# Patient Record
Sex: Female | Born: 1995 | Race: White | Hispanic: No | Marital: Single | Smoking: Never smoker
Health system: Southern US, Community
[De-identification: ages and names within clinical notes are randomized; demographics above are authoritative.]

## PROBLEM LIST (undated history)

## (undated) DIAGNOSIS — G43909 Migraine, unspecified, not intractable, without status migrainosus: Secondary | ICD-10-CM

## (undated) HISTORY — PX: TONSILLECTOMY: SUR1361

---

## 2015-01-22 DIAGNOSIS — G43909 Migraine, unspecified, not intractable, without status migrainosus: Secondary | ICD-10-CM | POA: Diagnosis not present

## 2015-04-19 DIAGNOSIS — B3320 Viral carditis, unspecified: Secondary | ICD-10-CM | POA: Diagnosis not present

## 2015-09-01 DIAGNOSIS — D224 Melanocytic nevi of scalp and neck: Secondary | ICD-10-CM | POA: Diagnosis not present

## 2015-09-01 DIAGNOSIS — D2261 Melanocytic nevi of right upper limb, including shoulder: Secondary | ICD-10-CM | POA: Diagnosis not present

## 2015-09-01 DIAGNOSIS — D225 Melanocytic nevi of trunk: Secondary | ICD-10-CM | POA: Diagnosis not present

## 2015-09-01 DIAGNOSIS — D2271 Melanocytic nevi of right lower limb, including hip: Secondary | ICD-10-CM | POA: Diagnosis not present

## 2015-10-27 DIAGNOSIS — J0140 Acute pansinusitis, unspecified: Secondary | ICD-10-CM | POA: Diagnosis not present

## 2015-11-25 ENCOUNTER — Ambulatory Visit (INDEPENDENT_AMBULATORY_CARE_PROVIDER_SITE_OTHER): Payer: BLUE CROSS/BLUE SHIELD | Admitting: Family Medicine

## 2015-11-25 ENCOUNTER — Encounter: Payer: Self-pay | Admitting: Family Medicine

## 2015-11-25 VITALS — BP 109/56 | HR 58 | Temp 97.6°F | Resp 16

## 2015-11-25 DIAGNOSIS — J069 Acute upper respiratory infection, unspecified: Secondary | ICD-10-CM

## 2015-11-25 MED ORDER — ALBUTEROL SULFATE HFA 108 (90 BASE) MCG/ACT IN AERS: 2.0000 | INHALATION_SPRAY | Freq: Four times a day (QID) | RESPIRATORY_TRACT | Status: DC | PRN

## 2015-11-25 MED ORDER — AZITHROMYCIN 250 MG PO TABS: ORAL_TABLET | ORAL | Status: DC

## 2015-11-25 NOTE — Progress Notes (Signed)
Patient ID: Claudia Austin, female   DOB: [birth year], 20 y.o.   MRN: [id number]  Patient presents today with symptoms of mild productive cough for the last week. Patient states that she was treated for sinus infection prior to that but did not complete the antibiotics that were prescribed to her. She has some nasal congestion but most of her symptoms are in the chest now. She denies any fever or chills. She has been coughing with exercising. She denies any wheezing. She admits to having asthma earlier in her childhood. She denies any abdominal pain, chest pain, shortness of breath, lower extremity edema, nausea, vomiting, diarrhea. She has not taken any medications today.  ROS: Negative except mentioned above.  Vitals as per Epic.  GENERAL: NAD HEENT: mild pharyngeal erythema, no exudate, no erythema of TMs, no cervical LAD RESP: mild rhonci, no accessory muscle use, no wheezing  CARD: RRR NEURO: CN II-XII grossly intact   A/P: URI- will treat with Z-Pak, albuterol when necessary, Delsym when necessary, Claritin when necessary, rest, hydration, seek medical attention if symptoms persist or worsen as discussed.

## 2015-12-02 ENCOUNTER — Encounter: Payer: Self-pay | Admitting: Family Medicine

## 2015-12-02 ENCOUNTER — Ambulatory Visit (INDEPENDENT_AMBULATORY_CARE_PROVIDER_SITE_OTHER): Payer: BLUE CROSS/BLUE SHIELD | Admitting: Family Medicine

## 2015-12-02 VITALS — BP 110/71 | HR 53 | Temp 98.0°F | Resp 14

## 2015-12-02 DIAGNOSIS — H9202 Otalgia, left ear: Secondary | ICD-10-CM | POA: Diagnosis not present

## 2015-12-02 DIAGNOSIS — R0981 Nasal congestion: Secondary | ICD-10-CM | POA: Diagnosis not present

## 2015-12-02 MED ORDER — PREDNISONE 20 MG PO TABS: ORAL_TABLET | ORAL | Status: DC

## 2015-12-02 NOTE — Addendum Note (Signed)
Addended by: Dione HousekeeperPATEL, Yoona Ishii N on: 12/02/2015 11:22 AM   Modules accepted: Orders

## 2015-12-02 NOTE — Progress Notes (Signed)
Patient ID: [name], female   DOB: [birth year], 20 y.o.   MRN: [id number]   Patient presents today with symptoms of left ear fullness. Patient states that she at times has problems hearing out of the ear. She was treated for upper respiratory infection and sinusitis last week. She is completed the antibiotic course. She denies any fever or chills. She has had some postnasal drip. She denies any significant sinus headache. She denies any drainage from the ear.  ROS: Negative except mentioned above.  Vitals as per Epic GENERAL: NAD HEENT: no pharyngeal erythema, no exudate, no erythema of TMs, increased lucency noted of the left TM, no drainage from the ear, no cervical LAD RESP: CTA B CARD: RRR NEURO: CN II-XII grossly intact   A/P: Left ear pain- likely related to sinus congestion and fluid, there does not appear to be any active infection, encourage patient to take Sudafed for a few days as well as Claritin for postnasal drip, will also prescribe short course of prednisone. If symptoms do persist or worsen will send patient to ENT as discussed.

## 2015-12-07 ENCOUNTER — Encounter: Payer: Self-pay | Admitting: Emergency Medicine

## 2015-12-07 ENCOUNTER — Emergency Department
Admission: EM | Admit: 2015-12-07 | Discharge: 2015-12-07 | Disposition: A | Discharge status: Home / Self Care | Payer: BLUE CROSS/BLUE SHIELD | Attending: Emergency Medicine | Admitting: Emergency Medicine

## 2015-12-07 DIAGNOSIS — G43901 Migraine, unspecified, not intractable, with status migrainosus: Secondary | ICD-10-CM

## 2015-12-07 DIAGNOSIS — G43801 Other migraine, not intractable, with status migrainosus: Secondary | ICD-10-CM | POA: Diagnosis not present

## 2015-12-07 DIAGNOSIS — R42 Dizziness and giddiness: Secondary | ICD-10-CM | POA: Diagnosis present

## 2015-12-07 HISTORY — DX: Migraine, unspecified, not intractable, without status migrainosus: G43.909

## 2015-12-07 LAB — BASIC METABOLIC PANEL
ANION GAP: 9 (ref 5–15)
BUN: 14 mg/dL (ref 6–20)
CALCIUM: 9.9 mg/dL (ref 8.9–10.3)
CO2: 28 mmol/L (ref 22–32)
Chloride: 102 mmol/L (ref 101–111)
Creatinine, Ser: 0.94 mg/dL (ref 0.44–1.00)
GFR calc Af Amer: >60 mL/min (ref >60)
GLUCOSE: 92 mg/dL (ref 65–99)
Potassium: 3.2 mmol/L — ABNORMAL LOW (ref 3.5–5.1)
Sodium: 139 mmol/L (ref 135–145)

## 2015-12-07 LAB — CBC
HCT: 40.1 % (ref 35.0–47.0)
Hemoglobin: 13.6 g/dL (ref 12.0–16.0)
MCH: 30.6 pg (ref 26.0–34.0)
MCHC: 33.9 g/dL (ref 32.0–36.0)
MCV: 90.2 fL (ref 80.0–100.0)
PLATELETS: 354 10*3/uL (ref 150–440)
RBC: 4.45 MIL/uL (ref 3.80–5.20)
RDW: 13.3 % (ref 11.5–14.5)
WBC: 16.5 10*3/uL — ABNORMAL HIGH (ref 3.6–11.0)

## 2015-12-07 LAB — URINALYSIS COMPLETE WITH MICROSCOPIC (ARMC ONLY)
BILIRUBIN URINE: NEGATIVE
GLUCOSE, UA: NEGATIVE mg/dL
HGB URINE DIPSTICK: NEGATIVE
Ketones, ur: NEGATIVE mg/dL
Leukocytes, UA: NEGATIVE
NITRITE: NEGATIVE
Protein, ur: NEGATIVE mg/dL
SPECIFIC GRAVITY, URINE: 1.004 — AB (ref 1.005–1.030)
pH: 7 (ref 5.0–8.0)

## 2015-12-07 LAB — POCT PREGNANCY, URINE: PREG TEST UR: NEGATIVE

## 2015-12-07 MED ORDER — KETOROLAC TROMETHAMINE 30 MG/ML IJ SOLN
30.0000 mg | Freq: Once | INTRAMUSCULAR | Status: AC
  Administered 2015-12-07: 30 mg via INTRAVENOUS
  Filled 2015-12-07: qty 1

## 2015-12-07 MED ORDER — SODIUM CHLORIDE 0.9 % IV BOLUS (SEPSIS)
1000.0000 mL | Freq: Once | INTRAVENOUS | Status: AC
  Administered 2015-12-07: 1000 mL via INTRAVENOUS

## 2015-12-07 MED ORDER — PROCHLORPERAZINE EDISYLATE 5 MG/ML IJ SOLN
10.0000 mg | Freq: Once | INTRAMUSCULAR | Status: AC
  Administered 2015-12-07: 10 mg via INTRAVENOUS
  Filled 2015-12-07: qty 2

## 2015-12-07 MED ORDER — DIPHENHYDRAMINE HCL 50 MG/ML IJ SOLN
25.0000 mg | Freq: Once | INTRAMUSCULAR | Status: AC
  Administered 2015-12-07: 25 mg via INTRAVENOUS
  Filled 2015-12-07: qty 1

## 2015-12-07 NOTE — ED Notes (Signed)
MD at bedside. 

## 2015-12-07 NOTE — ED Provider Notes (Addendum)
[facility] Emergency Department Provider Note   ____________________________________________  Time seen: Approximately 950 PM  I have reviewed the triage vital signs and the nursing notes.   HISTORY  Chief Complaint Dizziness    HPI Claudia Austin is a 20 y.o. female with a history of migraine headaches who is presenting to the emergency department today with a persistent migraine. She says that yesterday she began to have a frontal migraine headache and took sumatriptan as well as Phenergan. She says that these medications usually relieve her symptoms and that the day after she has a migraines usually feels tired. However, she has had persistent migraine headache with about a 3 out of 10 pain behind the bilateral eyes. She has photophobia and has also had lightheadedness and vomiting today. Because of the persistent symptoms she came in to the emergency room for further evaluation and treatment. She does not know any factors that set off her migraines. She says she has been having them for about a year.   Past Medical History:  Diagnosis Date  . Migraine     There are no active problems to display for this patient.   Past Surgical History:  Procedure Laterality Date  . TONSILLECTOMY Bilateral    age 61    Current Outpatient Rx  . Order #: [id number] Class: Normal  . Order #: [id number] Class: Normal  . Order #: [id number] Class: Normal    Allergies Review of patient's allergies indicates no known allergies.  Family History  Problem Relation Age of Onset  . Hypertension Mother   . Diabetes Maternal Grandmother     Social History Social History  Substance Use Topics  . Smoking status: Never Smoker  . Smokeless tobacco: Never Used  . Alcohol use No    Review of Systems Constitutional: No fever/chills Eyes: No visual changes. ENT: No sore throat. Cardiovascular: Denies chest pain. Respiratory: Denies shortness of  breath. Gastrointestinal: No abdominal pain.  No nausea, no vomiting.  No diarrhea.  No constipation. Genitourinary: Negative for dysuria. Musculoskeletal: Negative for back pain. Skin: Negative for rash. Neurological: Negative for focal weakness or numbness.  10-point ROS otherwise negative.  ____________________________________________   PHYSICAL EXAM:  VITAL SIGNS: ED Triage Vitals  Enc Vitals Group     BP 12/07/15 2045 136/79     Pulse Rate 12/07/15 2045 72     Resp 12/07/15 2045 18     Temp 12/07/15 2045 97.7 F (36.5 C)     Temp Source 12/07/15 2045 Oral     SpO2 12/07/15 2045 100 %     Weight 12/07/15 2045 132 lb (59.9 kg)     Height 12/07/15 2045  (1.753 m)     Head Circumference --      Peak Flow --      Pain Score 12/07/15 2145 6     Pain Loc --      Pain Edu? --      Excl. in GC? --     Constitutional: Alert and oriented. Well appearing and in no acute distress. Eyes: Conjunctivae are normal. PERRL. EOMI. Head: Atraumatic. Nose: No congestion/rhinnorhea. Mouth/Throat: Mucous membranes are moist.   Neck: No stridor.   Cardiovascular: Normal rate, regular rhythm. Grossly normal heart sounds.   Respiratory: Normal respiratory effort.  No retractions. Lungs CTAB. Gastrointestinal: Soft and nontender. No distention.  Musculoskeletal: No lower extremity tenderness nor edema.  No joint effusions. Neurologic:  Normal speech and language. No gross focal neurologic deficits are appreciated.  Skin:  Skin is warm, dry and intact. No rash noted. Psychiatric: Mood and affect are normal. Speech and behavior are normal.  ____________________________________________   LABS (all labs ordered are listed, but only abnormal results are displayed)  Labs Reviewed  BASIC METABOLIC PANEL - Abnormal; Notable for the following:       Result Value   Potassium 3.2 (*)    All other components within normal limits  CBC - Abnormal; Notable for the following:    WBC 16.5 (*)     All other components within normal limits  URINALYSIS COMPLETEWITH MICROSCOPIC ([facility] ONLY) - Abnormal; Notable for the following:    Color, Urine COLORLESS (*)    APPearance CLEAR (*)    Specific Gravity, Urine 1.004 (*)    Bacteria, UA RARE (*)    Squamous Epithelial / LPF 0-5 (*)    All other components within normal limits  POC URINE PREG, ED  POCT PREGNANCY, URINE   ____________________________________________  EKG   ____________________________________________  RADIOLOGY   ____________________________________________   PROCEDURES  Procedures  ____________________________________________   INITIAL IMPRESSION / ASSESSMENT AND PLAN / ED COURSE  Pertinent labs & imaging results that were available during my care of the patient were reviewed by me and considered in my medical decision making (see chart for details).  ----------------------------------------- 11:06 PM on 12/07/2015 -----------------------------------------  Patient with status migrainosus. Will switch to IV pain medications. Signed out to Dr. Manson Passey.  Status migrainosus.   ____________________________________________   FINAL CLINICAL IMPRESSION(S) / ED DIAGNOSES  Final diagnoses:  None      NEW MEDICATIONS STARTED DURING THIS VISIT:  New Prescriptions   No medications on file     Note:  This document was prepared using Dragon voice recognition software and may include unintentional dictation errors.    [name], MD 12/07/15 2306  Patient asymptomatic after IV migraine treatment. Will be discharged home. Will follow-up with her current care doctor, Dr. Sherryll Burger at the [facility].   [name], MD 12/07/15 [phone]

## 2015-12-07 NOTE — ED Notes (Signed)
Coach at the bedside. Pt states 2/10 headache dizziness almost gone. Pt states feeling much better.

## 2015-12-07 NOTE — ED Triage Notes (Signed)
Pt reports had migraine yesterday and took sumatriptan 50 mg and phenergan 25 mg yesterday, reports since yesterday pt has been feeling shaky, dizzy, and nauseous. Pt reports migraine has been relieved. Pt alert and speaking in complete sentences. Speaking in complete sentences, pt appears in no acute distress.

## 2015-12-07 NOTE — ED Notes (Signed)
Pt reports had migraine yesterday and took sumatriptan 50 mg and phenergan 25 mg yesterday, reports since yesterday pt has been feeling shaky, dizzy, and nauseous vomited 5 times since 7pm. Pt reports migraine has been relieved. Pt states slight headache dizziness 6/10.

## 2015-12-07 NOTE — ED Notes (Signed)
Pt just given IVP pain and nausea meds. No distress noted. Will continue to monitor.

## 2015-12-07 NOTE — ED Notes (Signed)
Discharge instructions reviewed with patient. Questions fielded by this RN. Patient verbalizes understanding of instructions. Patient discharged home in stable condition per Schaevitz MD. No acute distress noted at time of discharge.   

## 2015-12-07 NOTE — ED Notes (Signed)
POC urine preg NEGATIVE. 

## 2016-02-11 ENCOUNTER — Ambulatory Visit (INDEPENDENT_AMBULATORY_CARE_PROVIDER_SITE_OTHER): Payer: BLUE CROSS/BLUE SHIELD | Admitting: Family Medicine

## 2016-02-11 VITALS — BP 120/57 | HR 68 | Temp 98.4°F | Resp 14

## 2016-02-11 DIAGNOSIS — J018 Other acute sinusitis: Secondary | ICD-10-CM

## 2016-02-11 MED ORDER — FLUTICASONE PROPIONATE 50 MCG/ACT NA SUSP: 2.0000 | Freq: Every day | NASAL | 6 refills | Status: DC

## 2016-02-11 MED ORDER — AMOXICILLIN 500 MG PO CAPS: 500.0000 mg | ORAL_CAPSULE | Freq: Two times a day (BID) | ORAL | 0 refills | Status: DC

## 2016-02-11 NOTE — Progress Notes (Signed)
Patient presents today with symptoms of sinus pressure, headache, fatigue, postnasal drip. Patient has had symptoms for the last week. She denies any fever, chills, severe headache, chest pain, shortness of breath. Her teeth have also been hurting due to the sinus pressure. Normal menstrual cycles, last menstrual cycle 3 weeks ago.  ROS: Negative except mentioned above.  GENERAL: NAD HEENT: mild pharyngeal erythema, no exudate, no erythema of TMs, mild sinus tenderness, no cervical LAD RESP: CTA B CARD: RRR NEURO: CN II-XII grossly intact   A/P: Sinusitis - Flonase when necessary, Sudafed when necessary, Claritin when necessary, Amoxicillin, rest, hydration, no athletic activity afebrile, follow-up when necessary.

## 2016-03-09 ENCOUNTER — Ambulatory Visit
Admission: RE | Admit: 2016-03-09 | Discharge: 2016-03-09 | Disposition: A | Discharge status: Home / Self Care | Payer: BLUE CROSS/BLUE SHIELD | Source: Ambulatory Visit | Attending: Family Medicine | Admitting: Family Medicine

## 2016-03-09 ENCOUNTER — Ambulatory Visit (INDEPENDENT_AMBULATORY_CARE_PROVIDER_SITE_OTHER): Payer: BLUE CROSS/BLUE SHIELD | Admitting: Family Medicine

## 2016-03-09 ENCOUNTER — Encounter: Payer: Self-pay | Admitting: Family Medicine

## 2016-03-09 VITALS — BP 115/54 | HR 82 | Temp 97.9°F

## 2016-03-09 DIAGNOSIS — R05 Cough: Secondary | ICD-10-CM | POA: Diagnosis not present

## 2016-03-09 DIAGNOSIS — R053 Chronic cough: Secondary | ICD-10-CM

## 2016-03-09 MED ORDER — AZITHROMYCIN 250 MG PO TABS: ORAL_TABLET | ORAL | 0 refills | Status: DC

## 2016-03-09 NOTE — Progress Notes (Signed)
Patient presents today for persistent nasal congestion and cough. Patient states that she does have postnasal drip that has continued since her last visit with me last month. She did finish the course of antibiotic that was given to her itch was amoxicillin. She denies any chest pain or shortness of breath. She denies any history of asthma. She denies any history of chronic allergies. She is no longer taking an antihistamine or decongestant or nasal steroid. She was on Sudafed for a few weeks but stopped using it recently. At times the cough is productive. She denies any known new allergies. She denies any weight loss or fever or myalgias.  ROS: Negative except mentioned above. Vitals as per Epic.  GENERAL: NAD HEENT: mild pharyngeal erythema, no exudate, no erythema of TMs, no cervical LAD RESP: CTA B CARD: RRR NEURO: CN II-XII grossly intact   A/P: Persistent cough, nasal congestion - will do chest x-ray, Z-Pak, start antihistamine and Flonase, consider inhaler, seek medical attention if symptoms persist or worsen as discussed.

## 2016-07-04 ENCOUNTER — Ambulatory Visit (INDEPENDENT_AMBULATORY_CARE_PROVIDER_SITE_OTHER): Payer: BLUE CROSS/BLUE SHIELD | Admitting: Family Medicine

## 2016-07-04 ENCOUNTER — Encounter: Payer: Self-pay | Admitting: Family Medicine

## 2016-07-04 VITALS — BP 114/49 | HR 58

## 2016-07-04 DIAGNOSIS — S39012A Strain of muscle, fascia and tendon of lower back, initial encounter: Secondary | ICD-10-CM

## 2016-07-04 DIAGNOSIS — M62830 Muscle spasm of back: Secondary | ICD-10-CM

## 2016-07-04 MED ORDER — NAPROXEN 500 MG PO TABS: 500.0000 mg | ORAL_TABLET | Freq: Two times a day (BID) | ORAL | 0 refills | Status: DC

## 2016-07-04 MED ORDER — CYCLOBENZAPRINE HCL 5 MG PO TABS: 5.0000 mg | ORAL_TABLET | Freq: Two times a day (BID) | ORAL | 0 refills | Status: DC | PRN

## 2016-07-13 NOTE — Progress Notes (Signed)
Patient presents today with symptoms of generalized lower back tightness. 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 She denies any incident or trauma. She denies any incontinence, radicular symptoms, foot drop, fever/chills. She has been taking some over-the-counter medication with some relief. She denies any chronic back problems.  ROS: Negative except mentioned above. Vitals as per Epic. GENERAL: NAD RESP: CTA B CARD: RRR MSK: No midline tenderness, mild generalized paravertebral tenderness in the upper lumbar area bilaterally, full range of motion, negative straight leg raise, 5 out of 5 strength of lower extremities, NV intact NEURO: CN II-XII grossly intact   A/P: Lower back strain, spasm - will treat with Naproxen and Flexeril when necessary, rehabilitation with trainer and modify activity as needed, this was discussed with trainer and patient. Seek medical attention if symptoms persist or worsen as discussed.

## 2016-08-31 ENCOUNTER — Ambulatory Visit: Payer: BLUE CROSS/BLUE SHIELD | Admitting: Family Medicine

## 2016-11-16 DIAGNOSIS — J309 Allergic rhinitis, unspecified: Secondary | ICD-10-CM | POA: Diagnosis not present

## 2016-11-16 DIAGNOSIS — J4540 Moderate persistent asthma, uncomplicated: Secondary | ICD-10-CM | POA: Diagnosis not present

## 2016-11-16 DIAGNOSIS — Z6820 Body mass index (BMI) 20.0-20.9, adult: Secondary | ICD-10-CM | POA: Diagnosis not present

## 2016-11-16 DIAGNOSIS — J0140 Acute pansinusitis, unspecified: Secondary | ICD-10-CM | POA: Diagnosis not present

## 2016-11-30 ENCOUNTER — Ambulatory Visit (INDEPENDENT_AMBULATORY_CARE_PROVIDER_SITE_OTHER): Payer: BLUE CROSS/BLUE SHIELD | Admitting: Family Medicine

## 2016-11-30 ENCOUNTER — Encounter: Payer: Self-pay | Admitting: Family Medicine

## 2016-11-30 VITALS — BP 120/68 | HR 65 | Temp 97.5°F | Resp 14 | Wt 135.2 lb

## 2016-11-30 DIAGNOSIS — R05 Cough: Secondary | ICD-10-CM | POA: Diagnosis not present

## 2016-11-30 DIAGNOSIS — R5383 Other fatigue: Secondary | ICD-10-CM | POA: Diagnosis not present

## 2016-11-30 DIAGNOSIS — R059 Cough, unspecified: Secondary | ICD-10-CM

## 2016-12-01 ENCOUNTER — Other Ambulatory Visit: Payer: Self-pay | Admitting: Family Medicine

## 2016-12-01 ENCOUNTER — Telehealth: Payer: Self-pay | Admitting: Internal Medicine

## 2016-12-01 DIAGNOSIS — R05 Cough: Secondary | ICD-10-CM

## 2016-12-01 DIAGNOSIS — R059 Cough, unspecified: Secondary | ICD-10-CM

## 2016-12-01 DIAGNOSIS — R5383 Other fatigue: Secondary | ICD-10-CM

## 2016-12-01 NOTE — Telephone Encounter (Signed)
This is the 1st available unless someone cancels. Nothing further needed.

## 2016-12-01 NOTE — Progress Notes (Signed)
Patient presents today with symptoms of chronic cough for the last few months, fatigue, shortness of breath. Patient states that she went to see her primary care physician about a week ago and was told that she had walking pneumonia. She states that she had some blood work done and was treated with a Z-Pak and Albuterol Inhaler. She did have an x-ray as well but does not know the official results of that x-ray. She denies any significant weight loss, hemoptysis, calf swelling or tenderness, palpitations, severe headache. She denies being a smoker. She has noticed excessive sweating at times. She has not checked her temperature when this has happened. She states that she has been using albuterol inhaler once in the morning and once in the evening. She denies any new allergens. She was told by her primary care physician to try taking a oral antihistamine which she has been doing. She does not feel he significant difference with using the Albuterol Inhaler or the antihistamine. She denies ever having any allergy testing in the past. She is able to do physical activity on the basketball court in particular but does feel winded/fatigued with physical activity. She denies any palpitations or chest pain. She would say that her bowel movements are more on the loose side instead of being constipated. She admits to getting abdominal cramps often in the past but has gone gluten free and these cramps have decreased greatly. She denies any recent travel outside of the country. She denies any recent tick bites. She denies any history of Lyme's disease or RMSF. She admits to a family history of thyroid disease with her mother and father.  ROS: Negative except mentioned above.  GENERAL: NAD HEENT: no pharyngeal erythema, no exudate, no erythema of TMs, no cervical LAD, no thyroid enlargement or nodules appreciated RESP: CTA B CARD: RRR ABD: +BS, NT, no rebound or guarding NEURO: CN II-XII grossly intact    A/P: Chronic  cough, fatigue, shortness of breath - asked patient to have lab results faxed to me that were done by her primary care physician, would add thyroid panel if not done by primary care physician, given symptoms above. Would also recommend that patient follow-up with pulmonary for further evaluation. May need spirometry, CT chest, etc. Discussed with patient the proper way of using her albuterol inhaler. Will seek medical attention if symptoms persist or worsen as discussed.

## 2016-12-01 NOTE — Telephone Encounter (Signed)
[facility] called Urgent new patient referral per dr patel for cough and fatigue Scheduled next available 8/24 9AM with Kasa Added to waitlist  Please advise

## 2016-12-05 LAB — VITAMIN D 25 HYDROXY (VIT D DEFICIENCY, FRACTURES): Vit D, 25-Hydroxy: 38.3 ng/mL (ref 30.0–100.0)

## 2016-12-05 LAB — TSH+FREE T4
FREE T4: 1.08 ng/dL (ref 0.82–1.77)
TSH: 1.44 u[IU]/mL (ref 0.450–4.500)

## 2016-12-05 NOTE — Progress Notes (Signed)
Patient presents today for symptoms of cough, fatigue, headaches, nausea. Patient states that she has had the symptoms for the last few months. She states that she was diagnosed with walking pneumonia by her primary care provider. She was put on a Z-Pak and completed this a few weeks ago. She states that at times she feels really hot however has not checked her temperature. She continues to have cough and some shortness of breath. She admits that there was blood work done and a chest x-ray done by her primary care provider. She believes a chest x-ray results were normal. She does not have the results of her blood work with her today. She denies any significant weight loss. She admits that her stools are more looser then constipated. She denies any palpitations during exercise or chest pain. In addition to her Z-Pak she was also given an albuterol inhaler which she has been using twice daily. She does not think that the inhaler has been helping much with her symptoms. She also was told to try taking an oral antihistamine daily. She denies any significant new environmental allergens. She denies any childhood diagnosis of asthma. She denies smoking cigarettes or any other illicit drugs. She denies any rash. She denies any recent tick bites. She denies taking any new medications or supplements. She does have family history of thyroid disease with her mother and also thyroid cancer with her father.  ROS: Negative except mentioned above.  GENERAL: NAD HEENT: no pharyngeal erythema, no exudate, no erythema of TMs, no cervical LAD RESP: CTA B CARD: RRR ABD: Positive bowel sounds, nontender NEURO: CN II-XII grossly intact   A/P: Cough, fatigue -would like patient to obtain results that were done by her primary care provider for my review. Physical exam benign today. Would recommend seeing pulmonary for further workup with spirometry, labs, etc. If any acute problems patient will seek medical attention. I've  advised on the proper way of using albuterol inhaler as needed. Continue taking oral antihistamine daily for now.

## 2016-12-08 ENCOUNTER — Ambulatory Visit (INDEPENDENT_AMBULATORY_CARE_PROVIDER_SITE_OTHER): Payer: BLUE CROSS/BLUE SHIELD | Admitting: Internal Medicine

## 2016-12-08 ENCOUNTER — Encounter: Payer: Self-pay | Admitting: Internal Medicine

## 2016-12-08 ENCOUNTER — Other Ambulatory Visit
Admission: RE | Admit: 2016-12-08 | Discharge: 2016-12-08 | Disposition: A | Discharge status: Home / Self Care | Payer: BLUE CROSS/BLUE SHIELD | Source: Ambulatory Visit | Attending: Internal Medicine | Admitting: Internal Medicine

## 2016-12-08 VITALS — BP 130/72 | HR 68 | Ht 69.0 in | Wt 138.0 lb

## 2016-12-08 DIAGNOSIS — R5383 Other fatigue: Secondary | ICD-10-CM | POA: Diagnosis not present

## 2016-12-08 DIAGNOSIS — R079 Chest pain, unspecified: Secondary | ICD-10-CM | POA: Diagnosis not present

## 2016-12-08 DIAGNOSIS — R05 Cough: Secondary | ICD-10-CM | POA: Diagnosis not present

## 2016-12-08 DIAGNOSIS — R059 Cough, unspecified: Secondary | ICD-10-CM

## 2016-12-08 LAB — PREGNANCY, URINE: PREG TEST UR: NEGATIVE

## 2016-12-08 NOTE — Patient Instructions (Signed)
Check pregnancy test Verify technique of inhaler use will refer to cardiology for chest pain

## 2016-12-08 NOTE — Progress Notes (Signed)
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 for 5-6 months Patient says this cough worsens with exercise although it does happen at rest Patient does and has been exposed to cats and dogs and says she is not allergic to that There is intermittent wheezing associated with her cough As a child she has had exercise-induced cough and shortness of breath and has had a cardiac workup as a child Growing up as a child patient has played for sports year Patient has associated increased shortness of breath and dyspnea on exertion Patient was given a Z-Pak on July 5 however she did not feel any current Patient was started on inhaled steroids and long-acting bronchodilators with Advair 250/50 and she was also given albuterol inhaler to be used 30 minutes prior to exercise at this time patient feels like the Advair has helped but not significantly improved her symptoms  Over the last several weeks patient states showed that she has had left-sided chest pain radiating down her left arm which has occurred at rest which lasts for minutes and then resolves  Patient sometimes has chest pain with exertion  Patient does have allergic rhinitis and takes Claritin daily and takes intranasal steroids as well Patient does not have any symptoms of reflux  Patient is a nonsmoker and is not exposed to secondhand smoke    PAST MEDICAL HISTORY :   has a past medical history of Migraine.  has a past surgical history that includes Tonsillectomy (Bilateral). Prior to Admission medications   Medication Sig Start Date End Date Taking? Authorizing Provider  albuterol (PROVENTIL  HFA;VENTOLIN HFA) 108 (90 Base) MCG/ACT inhaler Inhale 2 puffs into the lungs every 6 (six) hours as needed for wheezing or shortness of breath. 11/25/15   Jolene ProvostPatel, Kirtida, MD  amoxicillin (AMOXIL) 500 MG capsule Take 1 capsule (500 mg total) by mouth 2 (two) times daily. Patient not taking: Reported on 03/09/2016 02/11/16   Jolene ProvostPatel, Kirtida, MD  azithromycin (ZITHROMAX) 250 MG tablet Use as directed for 5 days. Patient not taking: Reported on 11/30/2016 03/09/16   Jolene ProvostPatel, Kirtida, MD  cyclobenzaprine (FLEXERIL) 5 MG tablet Take 1 tablet (5 mg total) by mouth 2 (two) times daily as needed for muscle spasms. Can cause drowsiness. Patient not taking: Reported on 11/30/2016 07/04/16   Jolene ProvostPatel, Kirtida, MD  fluticasone [facility](FLONASE) 50 MCG/ACT nasal spray Place 2 sprays into both nostrils daily. Patient not taking: Reported on 11/30/2016 02/11/16   Jolene ProvostPatel, Kirtida, MD  naproxen (NAPROSYN) 500 MG tablet Take 1 tablet (500 mg total) by mouth 2 (two) times daily with a meal. Patient not taking: Reported on 11/30/2016 07/04/16   Jolene ProvostPatel, Kirtida, MD  predniSONE (DELTASONE) 20 MG tablet 2 tabs PO qd x 4 days. Patient not taking: Reported on 03/09/2016 12/02/15   Jolene ProvostPatel, Kirtida, MD   No Known Allergies  FAMILY HISTORY:  family history includes Diabetes in her maternal grandmother; Hypertension in her mother. SOCIAL HISTORY:  reports that she has never smoked. She has never used smokeless tobacco. She reports that she does not drink alcohol.  REVIEW OF SYSTEMS:   Constitutional: Negative for fever, chills, weight loss, malaise/fatigue and diaphoresis. + fatigue HENT:  Negative for hearing loss, ear pain, nosebleeds, congestion, sore throat, neck pain, tinnitus and ear discharge.   Eyes: Negative for blurred vision, double vision, photophobia, pain, discharge and redness.  Respiratory: + cough, hemoptysis, sputum production,+ shortness of breath, +wheezing and stridor.   Cardiovascular: + chest pain, +palpitations,  -orthopnea, -claudication, -leg swelling and PND.  Gastrointestinal: Negative for heartburn, nausea, vomiting, abdominal pain, diarrhea, constipation, blood in stool and melena.  Genitourinary: Negative for dysuria, urgency, frequency, hematuria and flank pain.  Musculoskeletal: Negative for myalgias, back pain, joint pain and falls.  Skin: Negative for itching and rash.  Neurological: Negative for dizziness, tingling, tremors, sensory change, speech change, focal weakness, seizures, loss of consciousness, weakness and headaches.  Endo/Heme/Allergies: Negative for environmental allergies and polydipsia. Does not bruise/bleed easily.  ALL OTHER ROS ARE NEGATIVE   BP 130/72 (BP Location: Left Arm, Cuff Size: Normal)   Pulse 68   Ht 5\' 9"  (1.753 m)   Wt 138 lb (62.6 kg)   SpO2 100%   BMI 20.38 kg/m   Physical Examination:   GENERAL:NAD, no fevers, chills, no weakness + fatigue HEAD: Normocephalic, atraumatic.  EYES: Pupils equal, round, reactive to light. Extraocular muscles intact. No scleral icterus.  MOUTH: Moist mucosal membrane.   EAR, NOSE, THROAT: Clear without exudates. No external lesions.  NECK: Supple. No thyromegaly. No nodules. No JVD.  PULMONARY:CTA B/L + wheezes, no crackles, no rhonchi CARDIOVASCULAR: S1 and S2. Regular rate and rhythm. No murmurs, rubs, or gallops. No edema.  GASTROINTESTINAL: Soft, nontender, nondistended. No masses. Positive bowel sounds.  MUSCULOSKELETAL: No swelling, clubbing, or edema. Range of motion full in all extremities.  NEUROLOGIC: Cranial nerves II through XII are intact. No gross focal neurological deficits.  SKIN: No ulceration, lesions, rashes, or cyanosis. Skin warm and dry. Turgor intact.  PSYCHIATRIC: Mood, affect within normal limits. The patient is awake, alert and oriented x 3. Insight, judgment intact.       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 and exercise-induced bronchospasms along with intermittent chest pain with left-sided arm radiation probable nonischemic but need to be ruled out with cardiology assessment, there is also likelihood of possible exercise-induced pulmonary hypertension  At this time patient will need full cardiac workup along with some other further testing  #1 check pregnancy test #2 verify technique of inhaler use #3 patient advised to continue with Advair 250/50 twice daily  #4 patient advised to use 2 puffs albuterol prior to exercise and to use as needed #5 cardiology referral for atypical chest pain in a 21 year old may need to consider stress test along with stress echo  Will follow-up in 3-4 weeks once assessments have been completed   Patient/Family are satisfied with Plan of action and management. All questions answered  Lucie LeatherKurian David Jahaan Vanwagner, M.D.  [facility]  Medical Director [facility] Medical Director [facility]

## 2016-12-08 NOTE — Addendum Note (Signed)
Addended by: Erin FullingKASA, Nadelyn Enriques on: 12/08/2016 02:18 PM   Modules accepted: Orders

## 2016-12-11 ENCOUNTER — Ambulatory Visit (INDEPENDENT_AMBULATORY_CARE_PROVIDER_SITE_OTHER): Payer: BLUE CROSS/BLUE SHIELD

## 2016-12-11 DIAGNOSIS — R079 Chest pain, unspecified: Secondary | ICD-10-CM | POA: Diagnosis not present

## 2016-12-11 NOTE — Progress Notes (Signed)
Cardiology Office Note  Date:  12/12/2016   ID:  Claudia Austin, DOB 02/12/1996, MRN [id number]  PCP:  Jolene ProvostPatel, Kirtida, MD   Chief Complaint  Patient presents with  . other    New patient. Per Dr. Belia HemanKasa for chest pain and SOB. Meds reviewed verbally with patient.     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 Allena KatzPatel and Dr. Dr. Belia HemanKasa for cough and chest pain Presents by referral from Dr. Belia HemanKasa for consultation of her chest pain  She has excellent exercise tolerance, currently playing basketball in college with no limitations Reports having more fatigue this year, occasional chest pain on the left at rest Reports driving home recently after practice and had left chest discomfort, sharp Underneath the left breast Occasionally has sharp shooting pains down her left arm If she has pain in her left arm holding the steering wheel, will change to her right arm  Notes reviewed from Dr. Belia HemanKasa, seen for chronic cough, treated with Advair and albuterol Also had  Z-Pak on July 5   she does not feel that the inhalers have significantly helped her symptoms  Does not feel that she has asthma   Patient is a nonsmoker and is not exposed to secondhand smoke  EKG personally reviewed by myself on todays visit Shows normal sinus rhythm with rate 64 bpm no significant ST or T-wave changes    PMH:   has a past medical history of Migraine.  PSH:    Past Surgical History:  Procedure Laterality Date  . TONSILLECTOMY Bilateral    age 21    Current Outpatient Prescriptions  Medication Sig Dispense Refill  . ADVAIR DISKUS 250-50 MCG/DOSE AEPB Take 1 puff by mouth 2 (two) times daily.  0  . albuterol (PROVENTIL HFA;VENTOLIN HFA) 108 (90 Base) MCG/ACT inhaler Inhale 2 puffs into the lungs every 6 (six) hours as needed for wheezing or shortness of breath. 1 Inhaler 0   No current facility-administered medications for this  visit.      Allergies:   Patient has no known allergies.   Social History:  The patient  reports that she has never smoked. She has never used smokeless tobacco. She reports that she does not drink alcohol or use drugs.   Family History:   family history includes Diabetes in her maternal grandmother; Hypertension in her mother.    Review of Systems: Review of Systems  Constitutional: Negative.   Respiratory: Negative.   Cardiovascular: Positive for chest pain.       Sharp left arm pain  Gastrointestinal: Negative.   Musculoskeletal: Negative.   Neurological: Negative.   Psychiatric/Behavioral: Negative.   All other systems reviewed and are negative.    PHYSICAL EXAM: VS:  BP 120/60 (BP Location: Right Arm, Patient Position: Sitting, Cuff Size: Normal)   Pulse 64   Ht 5\' 9"  (1.753 m)   Wt 134 lb 8 oz (61 kg)   BMI 19.86 kg/m  , BMI Body mass index is 19.86 kg/m. GEN: Well nourished, well developed, in no acute distress  HEENT: normal  Neck: no JVD, carotid bruits, or masses Cardiac: RRR; no murmurs, rubs, or gallops,no edema  Respiratory:  clear to auscultation bilaterally, normal work of breathing GI: soft, nontender, nondistended, + BS MS: no deformity or atrophy  Skin: warm and dry, no rash Neuro:  Strength and sensation are intact Psych: euthymic mood, full affect    Recent Labs: 12/04/2016:  TSH 1.440    Lipid Panel No results found for: CHOL, HDL, LDLCALC, TRIG    Wt Readings from Last 3 Encounters:  12/12/16 134 lb 8 oz (61 kg)  12/08/16 138 lb (62.6 kg)  11/30/16 135 lb 3.2 oz (61.3 kg)       ASSESSMENT AND PLAN:  Chest pain, unspecified type - Plan: EKG 12-Lead Atypical in nature We reviewed the recent treadmill stress test results with her performed yesterday She achieved 15 Mets,  exercised for over 13 minutes with no symptoms of chest pain or arm pain  Good exercise tolerance, adequate blood pressure response in recovery  Given the above  and atypical presentation, normal exam, normal EKG, I do not think she has an underlying cardiac issue .  mentioned if symptoms get worse echocardiogram could be performed .  This would likely be normal Symptoms more consistent with muscular  Skeletal or  muscular ligamental issue   Chronic fatigue  etiology unclear, recommended She maintain good sleep hygiene, could try B-12 uncertain if stress is playing a role  Left arm pain  atypical in nature, sharp quick down the left arm sounds more musculaskeletal or even neurologic . Does not happen with exertion  Chronic cough  no cough on her visit today, lungs are clear   seems to be much improved  Disposition:   F/U  As needed   Patient was seen in consultation for Dr.  Belia HemanKasa and will be referred back to his office from going care of the issues detailed above    Total encounter time more than 60 minutes  Greater than 50% was spent in counseling and coordination of care with the patient    Orders Placed This Encounter  Procedures  . EKG 12-Lead     Signed, Dossie Arbourim Daymon Hora, M.D., Ph.D. 12/12/2016  [facility], [place] [phone]

## 2016-12-12 ENCOUNTER — Encounter: Payer: Self-pay | Admitting: Cardiovascular Disease

## 2016-12-12 ENCOUNTER — Ambulatory Visit (INDEPENDENT_AMBULATORY_CARE_PROVIDER_SITE_OTHER): Payer: BLUE CROSS/BLUE SHIELD | Admitting: Cardiovascular Disease

## 2016-12-12 VITALS — BP 120/60 | HR 64 | Ht 69.0 in | Wt 134.5 lb

## 2016-12-12 DIAGNOSIS — R5382 Chronic fatigue, unspecified: Secondary | ICD-10-CM | POA: Insufficient documentation

## 2016-12-12 DIAGNOSIS — M79602 Pain in left arm: Secondary | ICD-10-CM | POA: Insufficient documentation

## 2016-12-12 DIAGNOSIS — R079 Chest pain, unspecified: Secondary | ICD-10-CM | POA: Insufficient documentation

## 2016-12-12 DIAGNOSIS — R053 Chronic cough: Secondary | ICD-10-CM | POA: Insufficient documentation

## 2016-12-12 DIAGNOSIS — R05 Cough: Secondary | ICD-10-CM | POA: Diagnosis not present

## 2016-12-12 LAB — EXERCISE TOLERANCE TEST
CSEPEDS: 5 s
CSEPHR: 93 %
Estimated workload: 15.4 METS
Exercise duration (min): 13 min
MPHR: 200 {beats}/min
Peak HR: 187 {beats}/min
Rest HR: 84 {beats}/min

## 2016-12-12 NOTE — Patient Instructions (Signed)
Medication Instructions:   No medication changes made  Labwork:  No new labs needed  Testing/Procedures:  No further testing at this time   Follow-Up: It was a pleasure seeing you in the office today. Please call us if you have new issues that need to be addressed before your next appt.  [phone]  Your physician wants you to follow-up in:  As needed  If you need a refill on your cardiac medications before your next appointment, please call your pharmacy.     

## 2017-01-05 ENCOUNTER — Ambulatory Visit: Payer: BLUE CROSS/BLUE SHIELD | Admitting: Pulmonary Disease

## 2017-01-05 ENCOUNTER — Institutional Professional Consult (permissible substitution): Payer: BLUE CROSS/BLUE SHIELD | Admitting: Internal Medicine

## 2017-01-05 ENCOUNTER — Ambulatory Visit: Payer: BLUE CROSS/BLUE SHIELD | Admitting: Internal Medicine

## 2017-01-08 ENCOUNTER — Ambulatory Visit: Payer: BLUE CROSS/BLUE SHIELD | Admitting: Pulmonary Disease

## 2017-02-14 DIAGNOSIS — Z23 Encounter for immunization: Secondary | ICD-10-CM | POA: Diagnosis not present

## 2017-02-15 ENCOUNTER — Ambulatory Visit (INDEPENDENT_AMBULATORY_CARE_PROVIDER_SITE_OTHER): Payer: BLUE CROSS/BLUE SHIELD | Admitting: Family Medicine

## 2017-02-15 VITALS — BP 127/72 | HR 67 | Temp 97.7°F | Resp 14

## 2017-02-15 DIAGNOSIS — S060X0A Concussion without loss of consciousness, initial encounter: Secondary | ICD-10-CM | POA: Diagnosis not present

## 2017-02-15 NOTE — Progress Notes (Signed)
Patient presents today with symptoms of concussion. Patient states that a few days ago she was in practice when someone's elbow came down on to her head. Patient denies any loss of consciousness. Patient denies any history of concussion in the past. Patient does have a history of migraines. She states that she has a headache of about 6-7 out of 10. She denies this being the worse headache she is ever had. She describes the headache as mostly generalized. She denies any nausea, vomiting, light sensitivity, noise sensitivity. She did try to go to class 2 days ago and was unable to concentrate due to her headache. She did have problems falling asleep yesterday due to her anxiety/headache. She denies any problems with appetite. She denies any significant cervical spine pain. She states that the muscles around the area hurt at around 2-3 out of 10. She denies any radicular symptoms. She denies any vision or hearing problems. She is not taking any medications for her symptoms at this time.  ROS: Negative except mentioned above. Vitals as per Epic. GENERAL: NAD HEENT: no pharyngeal erythema, no exudate, no erythema of TMs, no cervical LAD, PERRL, EOMI RESP: CTA B CARD: RRR MSK: No midline tenderness along cervical spine, mild upper trap tenderness bilaterally, full range of motion, negative Spurling's, 5 out of 5 strength of upper and lower extremities NEURO: CN II-XII grossly intact, -Rombergs  A/P: Concussion - continue follow protocol, encourage patient to try going to class in short increments initially and to also do classwork/homework the same way. If this is unsuccessful she should stick to just doing classwork and homework at home in short increments. Patient addresses understanding of this and she will discuss this with her academic advisor. Can take Tylenol/Motrin when necessary for pain. Will continue to do active rehabilitation with the trainer. If symptoms are persistent or worsening she will seek  medical attention as discussed. At this current time she does not want to take anything to help her anxiety with sleep. She will see how the next few nights go and will let me know. Will discuss above with trainer.

## 2017-02-27 ENCOUNTER — Encounter: Payer: Self-pay | Admitting: Family Medicine

## 2017-02-27 ENCOUNTER — Ambulatory Visit (INDEPENDENT_AMBULATORY_CARE_PROVIDER_SITE_OTHER): Payer: BLUE CROSS/BLUE SHIELD | Admitting: Family Medicine

## 2017-02-27 VITALS — BP 106/64 | HR 77 | Temp 97.8°F | Resp 14

## 2017-02-27 DIAGNOSIS — S060X0D Concussion without loss of consciousness, subsequent encounter: Secondary | ICD-10-CM | POA: Diagnosis not present

## 2017-02-27 NOTE — Progress Notes (Signed)
Patient presents today for follow-up regarding concussion. Patient has been now asymptomatic for 3 days. She was able to go to class yesterday without any problems. She has not gone to class today at. Looking over her intact tested looks like a reaction time has not returned back to baseline. She has been attending practice and watching. She denies any athletic activity so far. She states that her appetite and sleep has returned back to normal. She has no headaches or dizziness. She is not taking any medications now.  ROS: Negative except mentioned above. Vitals as per Epic.  GENERAL: NAD RESP: CTA B CARD: RRR NEURO: CN II-XII grossly intact   A/P: Concussion - appears to be asymptomatic, would like her to go to class today again to make sure that she has no symptoms develop, will have her repeat IMPACT tomorrow, will likely start step 1 progression activity tomorrow. Will discuss this with trainer. Patient has no questions regarding above.

## 2017-03-01 ENCOUNTER — Encounter: Payer: Self-pay | Admitting: Family Medicine

## 2017-03-01 ENCOUNTER — Ambulatory Visit (INDEPENDENT_AMBULATORY_CARE_PROVIDER_SITE_OTHER): Payer: BLUE CROSS/BLUE SHIELD | Admitting: Family Medicine

## 2017-03-01 VITALS — BP 122/65 | HR 58 | Temp 98.1°F | Resp 14

## 2017-03-01 DIAGNOSIS — R0981 Nasal congestion: Secondary | ICD-10-CM | POA: Diagnosis not present

## 2017-03-02 NOTE — Progress Notes (Signed)
Patient presents today for follow-up regarding concussion. Patient denies any symptoms at this time. She states that she has some nasal congestion with minimal headache. She states that these symptoms started yesterday. She has had no problems in class with concentration. Denies any problems with appetite or sleep. Denies any fever. Patient has been sitting through practice without any issues. She has been able to complete schoolwork without any issues.  ROS: Negative except mentioned above. Vitals as per Epic. GENERAL: NAD HEENT: no pharyngeal erythema, no exudate, no erythema of TMs, no cervical LAD RESP: CTA B CARD: RRR NEURO: CN II-XII grossly intact   A/P: Concussion-reviewed IMPACT test, will have patient start progression with activity per protocol. If patient has any symptoms she is to inform her trainer. Would try Flonase, Zyrtec, or Claritin when necessary for nasal symptoms. Seek medical attention if symptoms persist or worsen as discussed.

## 2017-03-22 ENCOUNTER — Encounter: Payer: Self-pay | Admitting: Family Medicine

## 2017-03-22 ENCOUNTER — Ambulatory Visit (INDEPENDENT_AMBULATORY_CARE_PROVIDER_SITE_OTHER): Payer: BLUE CROSS/BLUE SHIELD | Admitting: Family Medicine

## 2017-03-22 VITALS — BP 110/61 | HR 59 | Temp 98.6°F | Resp 14

## 2017-03-22 DIAGNOSIS — J069 Acute upper respiratory infection, unspecified: Secondary | ICD-10-CM

## 2017-03-22 MED ORDER — AZITHROMYCIN 250 MG PO TABS: ORAL_TABLET | ORAL | 0 refills | Status: AC

## 2017-03-22 NOTE — Progress Notes (Signed)
Patient presents today for mild productive cough. Patient started off with sore throat and nasal congestion. 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 She denies any chest pain, shortness of breath, nausea, vomiting, abdominal pain. Her last menstrual period was 3 weeks ago. She has been using her albuterol inhaler prior to exercise. She has not used it at other times throughout the day.  ROS: Negative except mentioned above. Vitals as per Epic. GENERAL: NAD HEENT: mild pharyngeal erythema, no exudate, no erythema of TMs, no cervical LAD RESP: CTA B, no accessory muscle use, no wheezing appreciated CARD: RRR NEURO: CN II-XII grossly intact   A/P: URI-will treat with Z-Pak, Delsym when necessary, Claritin when necessary, Albuterol when necessary, rest, hydration, seek medical attention if symptoms persist or worsen.

## 2017-04-13 ENCOUNTER — Encounter: Payer: Self-pay | Admitting: Family Medicine

## 2017-04-13 ENCOUNTER — Ambulatory Visit (INDEPENDENT_AMBULATORY_CARE_PROVIDER_SITE_OTHER): Payer: BLUE CROSS/BLUE SHIELD | Admitting: Family Medicine

## 2017-04-13 VITALS — BP 118/65 | HR 61 | Temp 98.4°F | Resp 14

## 2017-04-13 DIAGNOSIS — R0982 Postnasal drip: Secondary | ICD-10-CM

## 2017-04-13 DIAGNOSIS — J4520 Mild intermittent asthma, uncomplicated: Secondary | ICD-10-CM | POA: Diagnosis not present

## 2017-04-13 MED ORDER — ALBUTEROL SULFATE HFA 108 (90 BASE) MCG/ACT IN AERS: 2.0000 | INHALATION_SPRAY | Freq: Four times a day (QID) | RESPIRATORY_TRACT | 0 refills | Status: AC | PRN

## 2017-04-13 MED ORDER — BENZONATATE 200 MG PO CAPS: 200.0000 mg | ORAL_CAPSULE | Freq: Two times a day (BID) | ORAL | 0 refills | Status: AC | PRN

## 2017-04-13 NOTE — Progress Notes (Signed)
Patient presents today with symptoms of persistent cough. Patient states that she took the Z-Pak that was prescribed to her couple weeks ago for upper respiratory infection but she continues to have a tickle in her throat which is causing her to cough. She does have some mucus production but is clear. She denies any fever, chest pain, shortness of breath, wheezing. She does have asthma and does take her albuterol inhaler prior to physical activity. Her trainer typically keeps her albuterol inhaler. She also does have Advair however does not use it consistently. She also takes an oral antihistamine but also not consistently. She denies any specific allergens. She denies any nasal congestion, headache, ear pain, sore throat.  ROS: Negative except mentioned above. Vitals as per Epic. GENERAL: NAD HEENT: mild pharyngeal erythema, no exudate, no erythema of TMs, no cervical LAD RESP: CTA B CARD: RRR NEURO: CN II-XII grossly intact   A/P: Cough, Postnasal Drip, Asthma - discussed with patient that she may need allergy testing given her history of URI symptoms looking back through Epic, will have patient take Advair consistently for a week as prescribed, I have also encouraged her to have her Albuterol Inhaler with her at all times in case she needs it, take oral antihistamine daily for a week, can try Tessalon Perles when necessary cough, should follow-up with me if symptoms persist/worsen as discussed.

## 2017-04-23 IMAGING — CR DG CHEST 2V
1 series · 2 of 2 positions shown · non-contrast
Comparison: None.

CLINICAL DATA: Cough and congestion for 1 month

EXAM:
CHEST  2 VIEW

[Series 1: dg chest 2 view · 0.14mm/px · 2 of 2 slices shown]
[im 1/2]
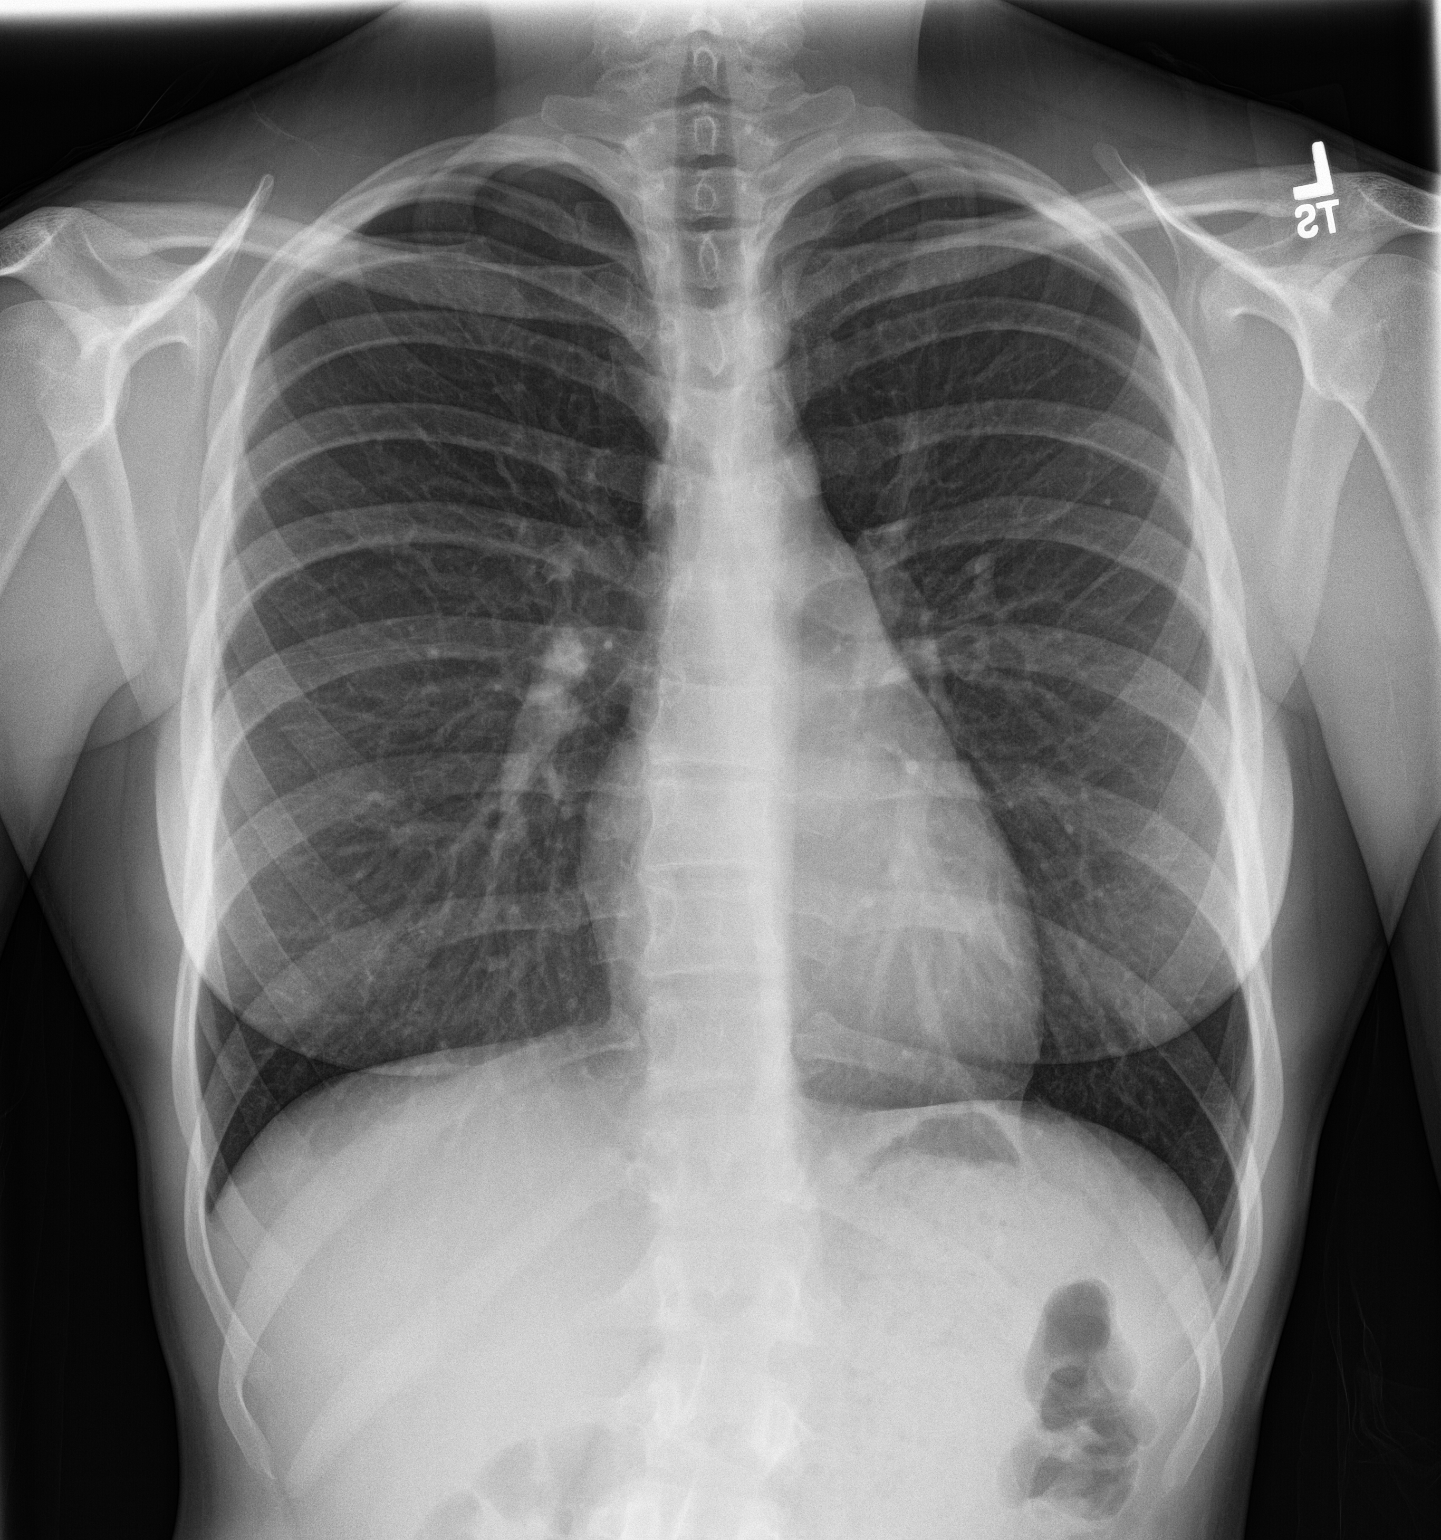
[im 2/2]
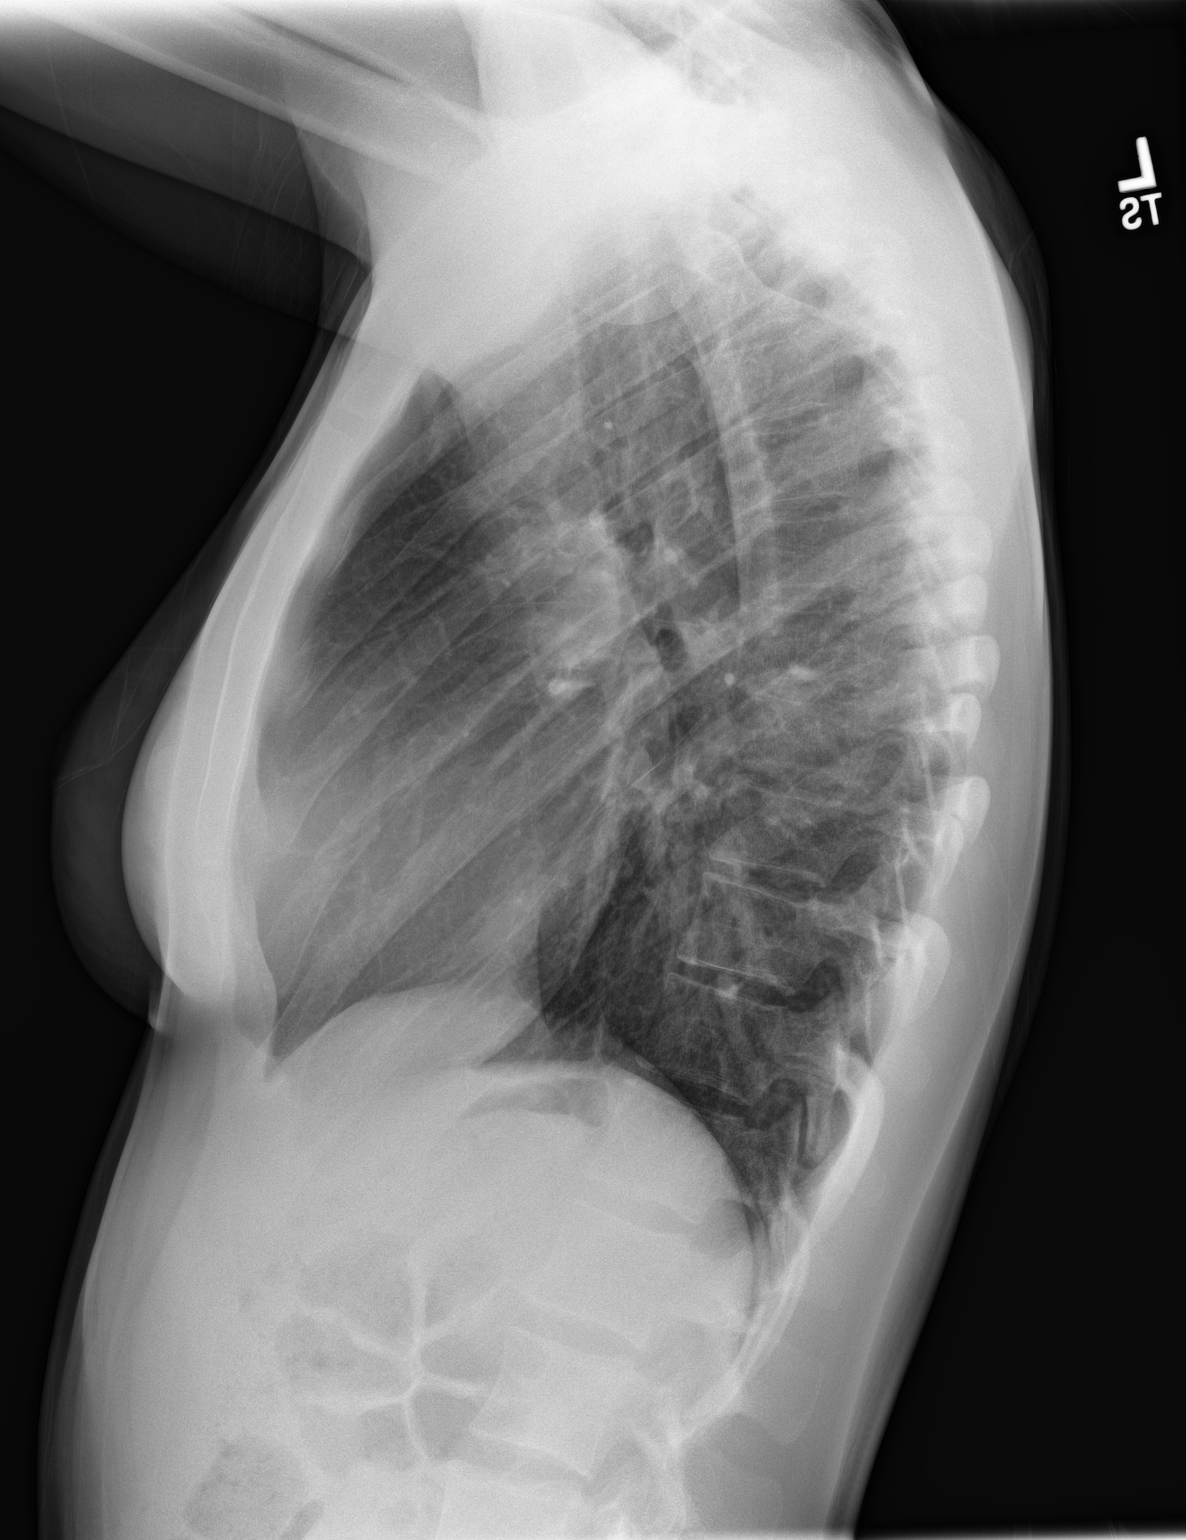

[2 of 2 positions shown; findings below may reference images not displayed]

FINDINGS: The heart size and mediastinal contours are within normal limits.
Both lungs are clear. The visualized skeletal structures are
unremarkable.
IMPRESSION: No active cardiopulmonary disease.

## 2017-04-30 ENCOUNTER — Ambulatory Visit
Admission: RE | Admit: 2017-04-30 | Discharge: 2017-04-30 | Disposition: A | Discharge status: Home / Self Care | Payer: BLUE CROSS/BLUE SHIELD | Source: Ambulatory Visit | Attending: Family Medicine | Admitting: Family Medicine

## 2017-04-30 ENCOUNTER — Ambulatory Visit (INDEPENDENT_AMBULATORY_CARE_PROVIDER_SITE_OTHER): Payer: BLUE CROSS/BLUE SHIELD | Admitting: Family Medicine

## 2017-04-30 ENCOUNTER — Encounter: Payer: Self-pay | Admitting: Family Medicine

## 2017-04-30 DIAGNOSIS — M545 Low back pain, unspecified: Secondary | ICD-10-CM

## 2017-04-30 DIAGNOSIS — M25551 Pain in right hip: Secondary | ICD-10-CM | POA: Diagnosis not present

## 2017-04-30 DIAGNOSIS — M4186 Other forms of scoliosis, lumbar region: Secondary | ICD-10-CM | POA: Diagnosis not present

## 2017-04-30 MED ORDER — CYCLOBENZAPRINE HCL 5 MG PO TABS
ORAL_TABLET | ORAL | 0 refills | Status: AC
Start: 2017-04-30 — End: ?

## 2017-04-30 MED ORDER — DICLOFENAC SODIUM 75 MG PO TBEC: 75.0000 mg | DELAYED_RELEASE_TABLET | Freq: Two times a day (BID) | ORAL | 0 refills | Status: DC

## 2017-05-11 ENCOUNTER — Other Ambulatory Visit: Payer: Self-pay | Admitting: Family Medicine

## 2017-05-11 DIAGNOSIS — M5416 Radiculopathy, lumbar region: Secondary | ICD-10-CM

## 2017-05-14 ENCOUNTER — Ambulatory Visit
Admission: RE | Admit: 2017-05-14 | Discharge: 2017-05-14 | Disposition: A | Discharge status: Home / Self Care | Payer: BLUE CROSS/BLUE SHIELD | Source: Ambulatory Visit | Attending: Family Medicine | Admitting: Family Medicine

## 2017-05-14 DIAGNOSIS — S3992XA Unspecified injury of lower back, initial encounter: Secondary | ICD-10-CM | POA: Diagnosis not present

## 2017-05-14 DIAGNOSIS — M5416 Radiculopathy, lumbar region: Secondary | ICD-10-CM

## 2017-05-14 DIAGNOSIS — M545 Low back pain: Secondary | ICD-10-CM | POA: Diagnosis not present

## 2017-05-14 DIAGNOSIS — M5116 Intervertebral disc disorders with radiculopathy, lumbar region: Secondary | ICD-10-CM | POA: Insufficient documentation

## 2017-05-14 DIAGNOSIS — R200 Anesthesia of skin: Secondary | ICD-10-CM | POA: Insufficient documentation

## 2017-05-18 NOTE — Progress Notes (Signed)
Patient presents today with symptoms of back discomfort for the last 2 weeks. Patient states that she noticed the back bothering her after she stepped out of her car. She denies any trauma or injury to the back. She denies any incontinence, radicular symptoms, fever, weight loss. She denies any weakness of her lower extremities. She has started to work with her Event organiserathletic trainer and PT. She admits to having some hip flexor tightness now since doing PT. No particular position causes her more discomfort. She denies having any major back issues or imaging of her back in the past.  ROS: Negative except mentioned above. Vitals as per Epic. GENERAL: NAD RESP: CTA B CARD: RRR MSK: No midline tenderness of the back, mild paravertebral tenderness in the right lower lumbar area, full range of motion, mild discomfort with flexion and extension, negative straight leg raise, normal toe and heel walk, normal gait, mildly tight hamstrings bilaterally NEURO: CN II-XII grossly intact   A/P: Lower back pain without radicular symptoms- will treat with NSAIDs and muscle relaxant, modify activity in the weight room and with practice, will discuss this further with the trainer, will get x-rays and consider doing MRI if symptoms persist/worsen. Continue rehabilitation with trainer and PT. Seek medical attention if symptoms persist or worsen as discussed.

## 2017-05-21 ENCOUNTER — Other Ambulatory Visit: Payer: Self-pay | Admitting: Family Medicine

## 2017-06-05 ENCOUNTER — Ambulatory Visit (INDEPENDENT_AMBULATORY_CARE_PROVIDER_SITE_OTHER): Payer: PRIVATE HEALTH INSURANCE | Admitting: Family Medicine

## 2017-06-05 VITALS — BP 119/67 | HR 66 | Temp 98.6°F | Resp 14

## 2017-06-05 DIAGNOSIS — M5416 Radiculopathy, lumbar region: Secondary | ICD-10-CM | POA: Diagnosis not present

## 2017-06-05 MED ORDER — DICLOFENAC SODIUM 75 MG PO TBEC: 75.0000 mg | DELAYED_RELEASE_TABLET | Freq: Two times a day (BID) | ORAL | 0 refills | Status: AC

## 2017-06-06 DIAGNOSIS — M5416 Radiculopathy, lumbar region: Secondary | ICD-10-CM | POA: Diagnosis not present

## 2017-06-07 NOTE — Progress Notes (Signed)
Patient presents today for follow-up regarding back pain. Patient states that her back pain has been manageable since her last visit with me. It has not stopped her from playing basketball. However over the last few days she has noticed some tingling/numbness on bilateral thigh areas. She is unsure whether it has been anterior lateral. It lasts for less than a minute and then goes away. She denies any incontinence, fever, weakness of lower extremities, foot drop. She is just recently finished a 12 day tapered dose of prednisone. She currently is not taking any anti-inflammatory medication or muscle relaxants. She has been working with PT/trainer on back rehabilitation as much as possible. She no longer has any hip flexor pain that she had a few weeks ago.  ROS: Negative except mentioned above. Vitals as per Epic. GENERAL: NAD RESP: CTA B CARD: RRR MSK: Back - mild lower right lumbar paravertebral tenderness, full range of motion, negative straight leg raise, mildly tight hamstrings, normal heel and toe walk, NV intact, normal gait NEURO: CN II-XII grossly intact   A/P: Lumbar disc bulge with radiculopathy: Recommend patient follow-up with Dr. Ardine Engiehl and be referred to Dr. Charisse MarchMinshew for further evaluation and treatment. Perhaps would benefit from a steroid injection. Will refill patient's Voltaren for now. Patient does have Flexeril if she needs to use it. Continue with back rehabilitation exercises. I recommended that she modify things in practice and in the weight room based on her symptoms. I have discussed this with the trainer. Will seek medical attention if any acute problems.

## 2017-06-29 DIAGNOSIS — M5416 Radiculopathy, lumbar region: Secondary | ICD-10-CM | POA: Diagnosis not present

## 2017-06-29 DIAGNOSIS — M5441 Lumbago with sciatica, right side: Secondary | ICD-10-CM | POA: Diagnosis not present

## 2017-07-20 ENCOUNTER — Other Ambulatory Visit: Payer: Self-pay | Admitting: Family Medicine

## 2017-07-20 MED ORDER — POLYMYXIN B-TRIMETHOPRIM 10000-0.1 UNIT/ML-% OP SOLN: 1.0000 [drp] | Freq: Four times a day (QID) | OPHTHALMIC | 0 refills | Status: AC

## 2017-08-27 ENCOUNTER — Encounter: Payer: Self-pay | Admitting: Family Medicine

## 2017-08-27 ENCOUNTER — Ambulatory Visit (INDEPENDENT_AMBULATORY_CARE_PROVIDER_SITE_OTHER): Payer: BLUE CROSS/BLUE SHIELD | Admitting: Family Medicine

## 2017-08-27 DIAGNOSIS — M5136 Other intervertebral disc degeneration, lumbar region: Secondary | ICD-10-CM

## 2017-08-27 NOTE — Progress Notes (Signed)
Patient presents today to talk about her chronic back pain. Patient states that throughout the basketball season her back pain was fairly consistent. There were days that were manageable and other days that she had increased discomfort. She states at her worst her pain was at a 8 out of 10. Patient did try NSAIDs and muscle relaxants as well as oral prednisone. She did also do rehabilitation as much as possible with her busy schedule with her athletic trainer and PT. She hasn't done any physical activity for at least the last 3 weeks. She states that her level of discomfort is about a 5 out of 10 now. She is able to do daily activities without any significant issues. She does notice increased discomfort with sitting for long periods. Her radicular symptoms have decreased since the end of the basketball season. She admits that she canceled her epidural injection with Dr. Charisse MarchMinshew, which was scheduled for the first week of April, because she wanted to try more conservative treatment over the rest of the school year and summer. She does not plan to play basketball her senior season due to fear of back symptoms increasing. She states that coaches are aware of this. She is not taking any medications on a consistent basis right now.   ROS: Negative except mentioned above. Vitals as per Epic.  Exam Deferred.  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 I have advised the patient to follow up with Dr. Charisse MarchMinshew to discuss her decision about not pursuing the epidural and continuing conservative treatment options such as PT and NSAIDs intermittently, etc. Patient agrees to do this in the coming weeks. She does state that she will be working on back rehabilitation with her athletic trainer 2-3 times a week. I have also advised her to meet with counseling services or Ms. Washington on-campus  regarding the emotions she may be feeling about no longer playing basketball at [employer]. She is always welcome to come back and speak to me as well. Patient addresses understanding and is appreciative.

## 2017-09-21 DIAGNOSIS — J069 Acute upper respiratory infection, unspecified: Secondary | ICD-10-CM | POA: Diagnosis not present

## 2017-12-24 DIAGNOSIS — D2261 Melanocytic nevi of right upper limb, including shoulder: Secondary | ICD-10-CM | POA: Diagnosis not present

## 2017-12-24 DIAGNOSIS — D224 Melanocytic nevi of scalp and neck: Secondary | ICD-10-CM | POA: Diagnosis not present

## 2017-12-24 DIAGNOSIS — D225 Melanocytic nevi of trunk: Secondary | ICD-10-CM | POA: Diagnosis not present

## 2017-12-24 DIAGNOSIS — Z872 Personal history of diseases of the skin and subcutaneous tissue: Secondary | ICD-10-CM | POA: Diagnosis not present

## 2018-04-08 DIAGNOSIS — R1084 Generalized abdominal pain: Secondary | ICD-10-CM | POA: Diagnosis not present

## 2018-04-08 DIAGNOSIS — N898 Other specified noninflammatory disorders of vagina: Secondary | ICD-10-CM | POA: Diagnosis not present

## 2018-04-08 DIAGNOSIS — Z01419 Encounter for gynecological examination (general) (routine) without abnormal findings: Secondary | ICD-10-CM | POA: Diagnosis not present

## 2018-04-08 DIAGNOSIS — K9041 Non-celiac gluten sensitivity: Secondary | ICD-10-CM | POA: Diagnosis not present

## 2018-04-08 DIAGNOSIS — F419 Anxiety disorder, unspecified: Secondary | ICD-10-CM | POA: Diagnosis not present

## 2018-04-08 DIAGNOSIS — Z23 Encounter for immunization: Secondary | ICD-10-CM | POA: Diagnosis not present

## 2018-04-08 DIAGNOSIS — Z0001 Encounter for general adult medical examination with abnormal findings: Secondary | ICD-10-CM | POA: Diagnosis not present

## 2018-04-08 DIAGNOSIS — Z91018 Allergy to other foods: Secondary | ICD-10-CM | POA: Diagnosis not present

## 2018-04-08 DIAGNOSIS — R197 Diarrhea, unspecified: Secondary | ICD-10-CM | POA: Diagnosis not present

## 2018-06-14 IMAGING — CR DG LUMBAR SPINE COMPLETE 4+V
1 series · 5 of 5 positions shown · non-contrast
Comparison: None.

CLINICAL DATA: Right low back pain x2 weeks, no known injury

EXAM:
LUMBAR SPINE - COMPLETE 4+ VIEW

[Series 1: dg lumbar spine complete 4 +v · 0.14mm/px · 5 of 5 slices shown]
[im 1/5]
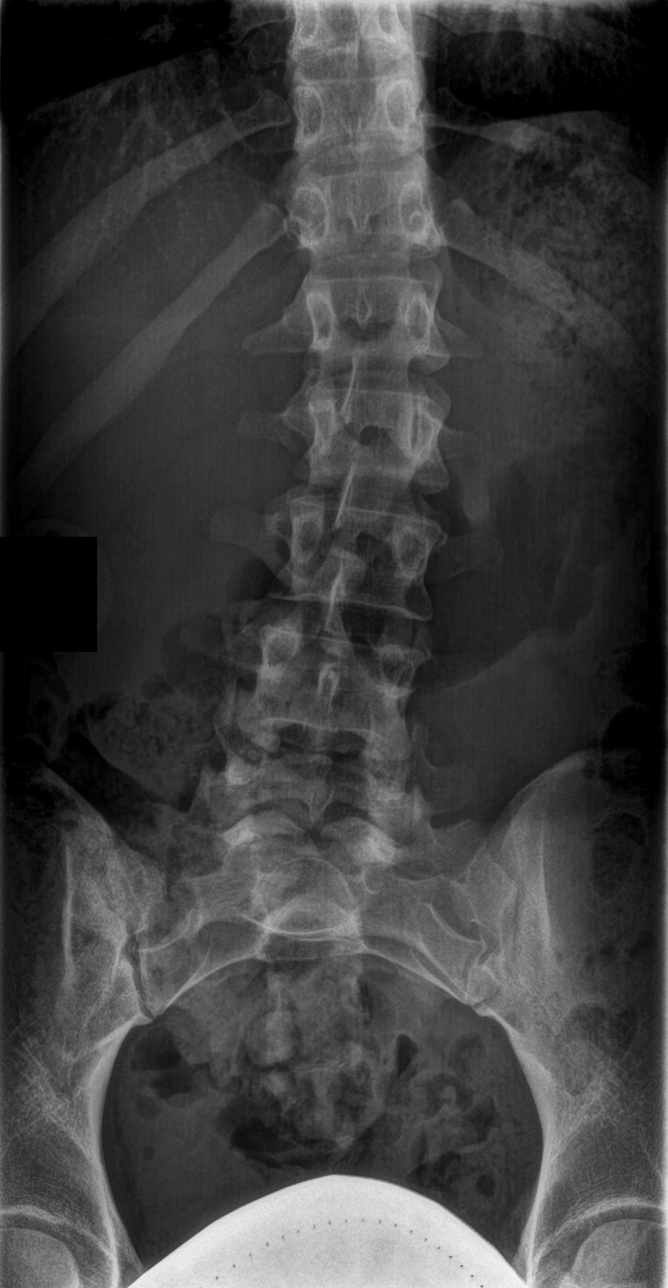
[im 2/5]
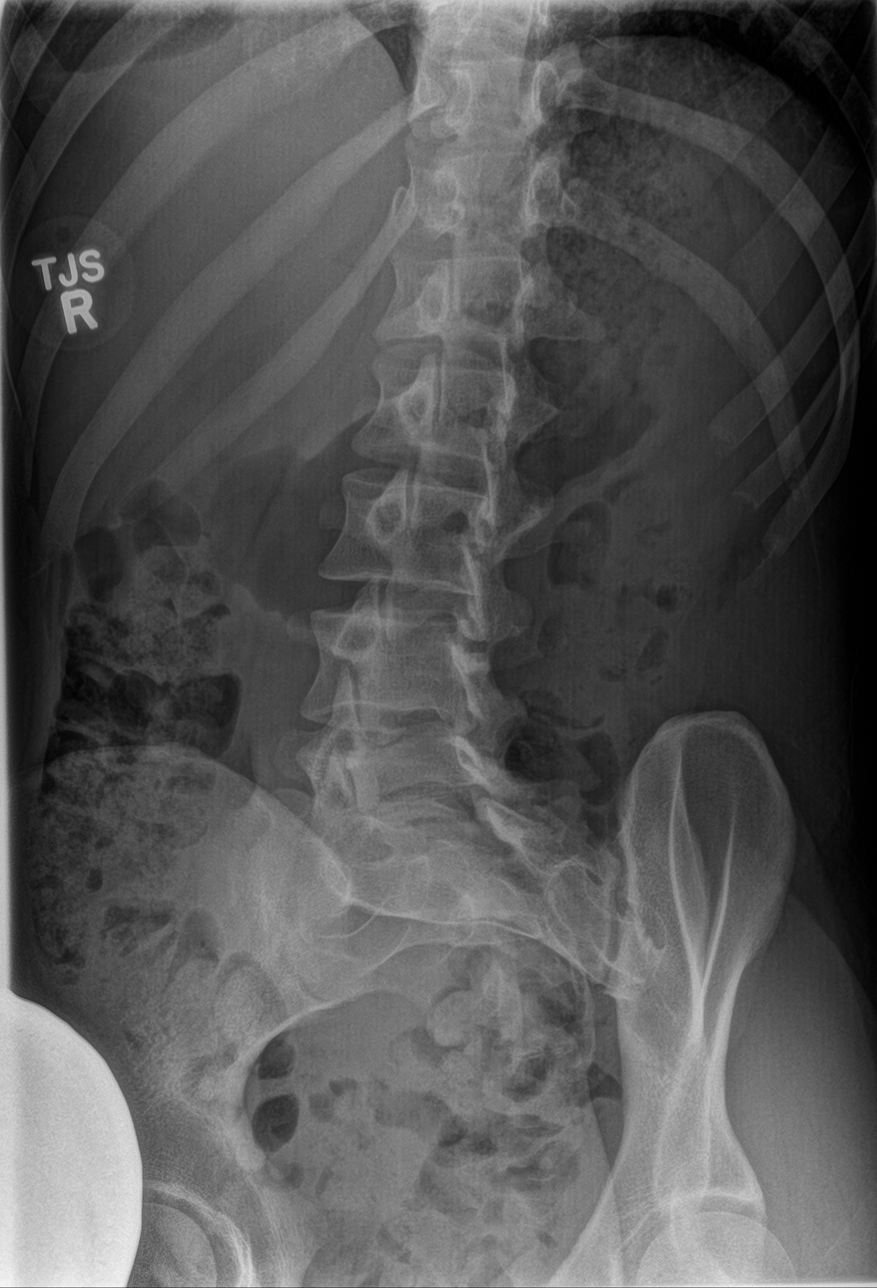
[im 3/5]
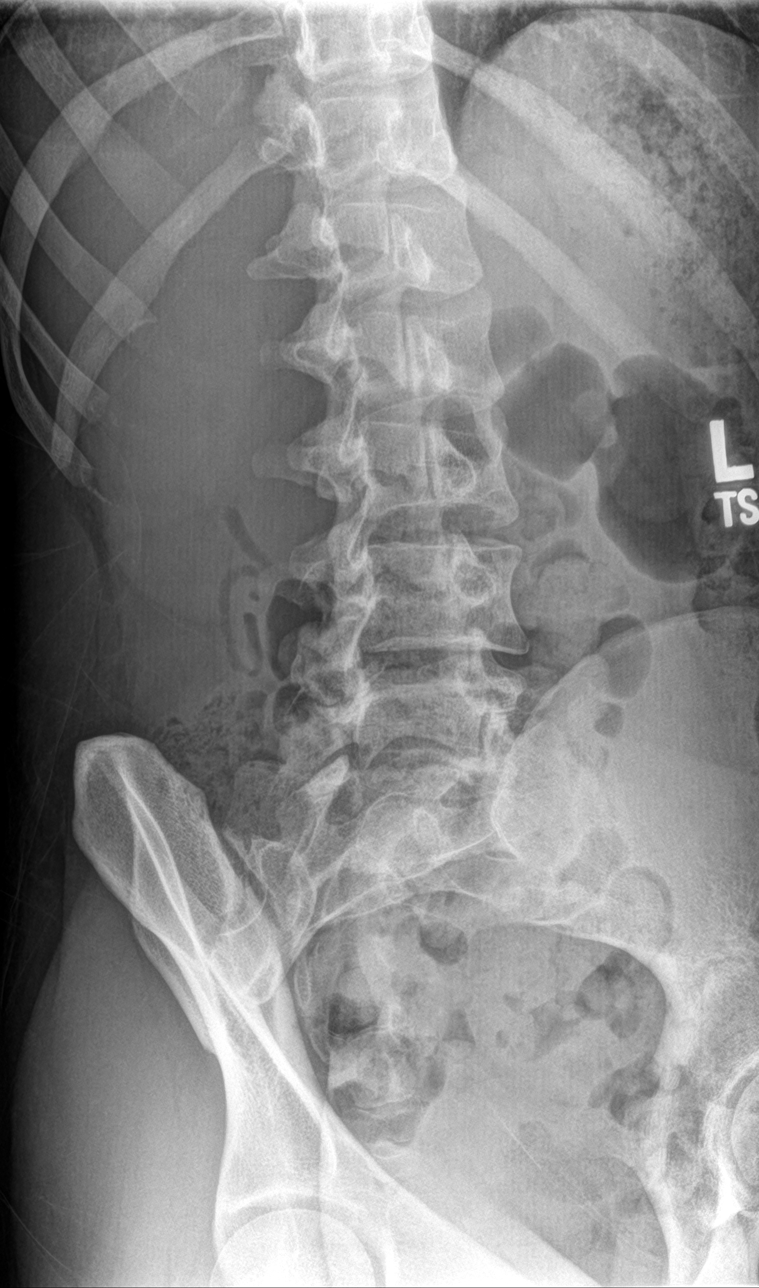
[im 4/5]
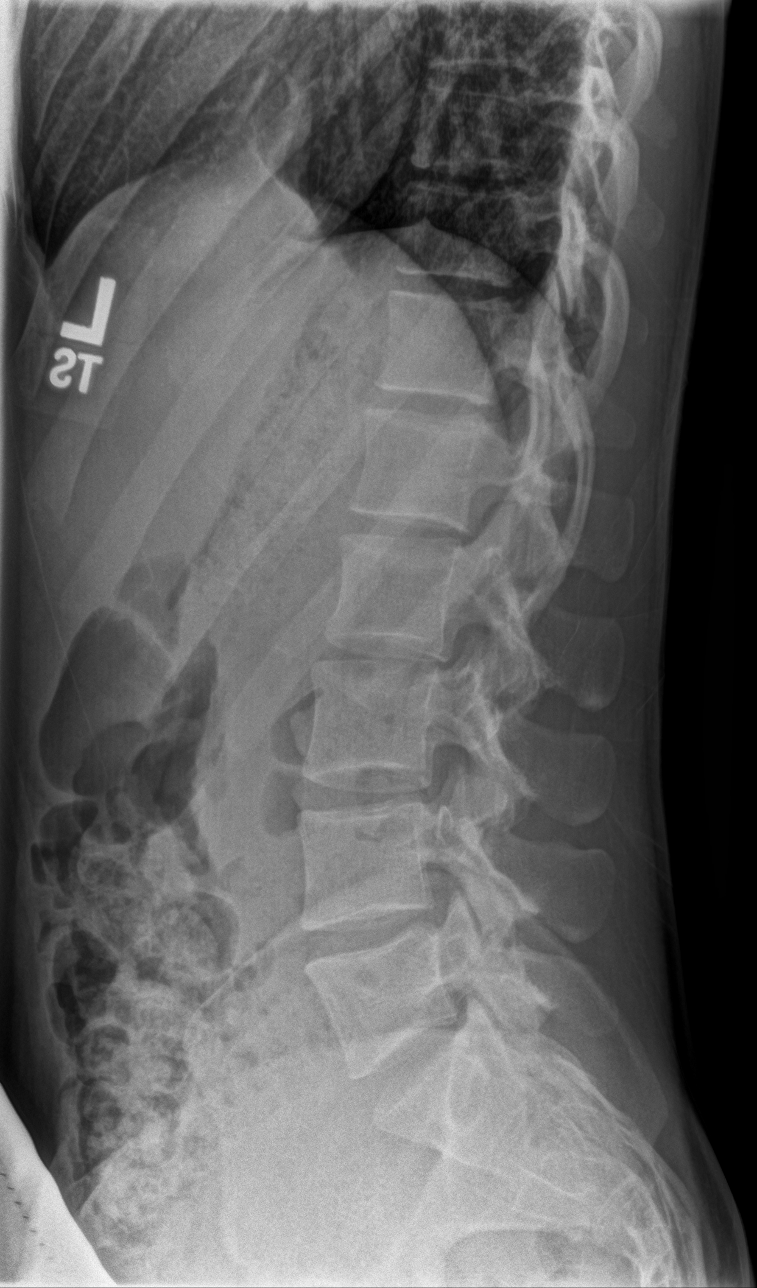
[im 5/5]
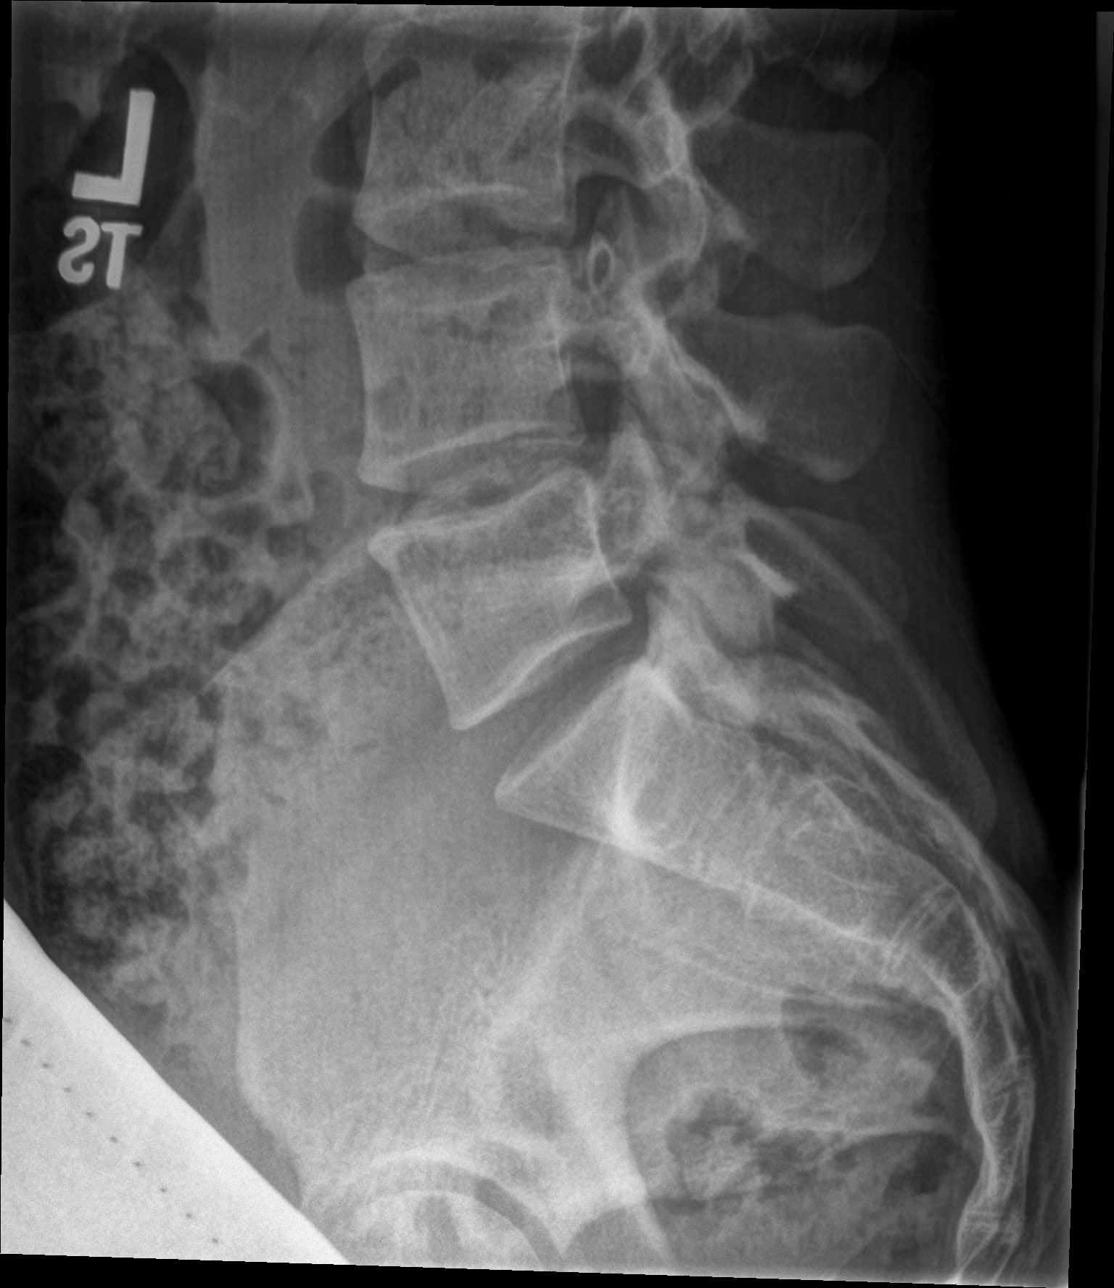

[5 of 5 positions shown; findings below may reference images not displayed]

FINDINGS: Five lumbar-type vertebral bodies.

Mild lumbar levoscoliosis.  Normal lumbar lordosis.

No evidence of fracture or dislocation. Vertebral body heights and
intervertebral disc spaces are maintained.

Visualized bony pelvis appears intact.
IMPRESSION: Negative.

## 2018-11-06 DIAGNOSIS — R197 Diarrhea, unspecified: Secondary | ICD-10-CM | POA: Diagnosis not present

## 2018-11-06 DIAGNOSIS — K9041 Non-celiac gluten sensitivity: Secondary | ICD-10-CM | POA: Diagnosis not present

## 2018-11-06 DIAGNOSIS — K589 Irritable bowel syndrome without diarrhea: Secondary | ICD-10-CM | POA: Diagnosis not present

## 2018-11-06 DIAGNOSIS — Z1331 Encounter for screening for depression: Secondary | ICD-10-CM | POA: Diagnosis not present

## 2018-11-06 DIAGNOSIS — R1084 Generalized abdominal pain: Secondary | ICD-10-CM | POA: Diagnosis not present

## 2019-04-21 DIAGNOSIS — M62830 Muscle spasm of back: Secondary | ICD-10-CM | POA: Diagnosis not present

## 2019-04-21 DIAGNOSIS — M542 Cervicalgia: Secondary | ICD-10-CM | POA: Diagnosis not present

## 2019-04-21 DIAGNOSIS — M9902 Segmental and somatic dysfunction of thoracic region: Secondary | ICD-10-CM | POA: Diagnosis not present

## 2019-04-21 DIAGNOSIS — M9901 Segmental and somatic dysfunction of cervical region: Secondary | ICD-10-CM | POA: Diagnosis not present

## 2019-04-23 DIAGNOSIS — M9902 Segmental and somatic dysfunction of thoracic region: Secondary | ICD-10-CM | POA: Diagnosis not present

## 2019-04-23 DIAGNOSIS — M62830 Muscle spasm of back: Secondary | ICD-10-CM | POA: Diagnosis not present

## 2019-04-23 DIAGNOSIS — M542 Cervicalgia: Secondary | ICD-10-CM | POA: Diagnosis not present

## 2019-04-23 DIAGNOSIS — M9901 Segmental and somatic dysfunction of cervical region: Secondary | ICD-10-CM | POA: Diagnosis not present

## 2019-04-28 DIAGNOSIS — M62830 Muscle spasm of back: Secondary | ICD-10-CM | POA: Diagnosis not present

## 2019-04-28 DIAGNOSIS — M9901 Segmental and somatic dysfunction of cervical region: Secondary | ICD-10-CM | POA: Diagnosis not present

## 2019-04-28 DIAGNOSIS — M542 Cervicalgia: Secondary | ICD-10-CM | POA: Diagnosis not present

## 2019-04-28 DIAGNOSIS — M9902 Segmental and somatic dysfunction of thoracic region: Secondary | ICD-10-CM | POA: Diagnosis not present

## 2019-04-30 DIAGNOSIS — M9901 Segmental and somatic dysfunction of cervical region: Secondary | ICD-10-CM | POA: Diagnosis not present

## 2019-04-30 DIAGNOSIS — M542 Cervicalgia: Secondary | ICD-10-CM | POA: Diagnosis not present

## 2019-04-30 DIAGNOSIS — M9902 Segmental and somatic dysfunction of thoracic region: Secondary | ICD-10-CM | POA: Diagnosis not present

## 2019-04-30 DIAGNOSIS — M62830 Muscle spasm of back: Secondary | ICD-10-CM | POA: Diagnosis not present

## 2019-05-07 DIAGNOSIS — M9902 Segmental and somatic dysfunction of thoracic region: Secondary | ICD-10-CM | POA: Diagnosis not present

## 2019-05-07 DIAGNOSIS — M542 Cervicalgia: Secondary | ICD-10-CM | POA: Diagnosis not present

## 2019-05-07 DIAGNOSIS — M9901 Segmental and somatic dysfunction of cervical region: Secondary | ICD-10-CM | POA: Diagnosis not present

## 2019-05-07 DIAGNOSIS — M62830 Muscle spasm of back: Secondary | ICD-10-CM | POA: Diagnosis not present

## 2019-05-12 DIAGNOSIS — M9901 Segmental and somatic dysfunction of cervical region: Secondary | ICD-10-CM | POA: Diagnosis not present

## 2019-05-12 DIAGNOSIS — M9902 Segmental and somatic dysfunction of thoracic region: Secondary | ICD-10-CM | POA: Diagnosis not present

## 2019-05-12 DIAGNOSIS — M62830 Muscle spasm of back: Secondary | ICD-10-CM | POA: Diagnosis not present

## 2019-05-12 DIAGNOSIS — M542 Cervicalgia: Secondary | ICD-10-CM | POA: Diagnosis not present

## 2019-05-14 DIAGNOSIS — M542 Cervicalgia: Secondary | ICD-10-CM | POA: Diagnosis not present

## 2019-05-14 DIAGNOSIS — M62830 Muscle spasm of back: Secondary | ICD-10-CM | POA: Diagnosis not present

## 2019-05-14 DIAGNOSIS — M9901 Segmental and somatic dysfunction of cervical region: Secondary | ICD-10-CM | POA: Diagnosis not present

## 2019-05-14 DIAGNOSIS — M9902 Segmental and somatic dysfunction of thoracic region: Secondary | ICD-10-CM | POA: Diagnosis not present

## 2019-05-19 DIAGNOSIS — M9901 Segmental and somatic dysfunction of cervical region: Secondary | ICD-10-CM | POA: Diagnosis not present

## 2019-05-19 DIAGNOSIS — M62830 Muscle spasm of back: Secondary | ICD-10-CM | POA: Diagnosis not present

## 2019-05-19 DIAGNOSIS — M9902 Segmental and somatic dysfunction of thoracic region: Secondary | ICD-10-CM | POA: Diagnosis not present

## 2019-05-19 DIAGNOSIS — M542 Cervicalgia: Secondary | ICD-10-CM | POA: Diagnosis not present

## 2019-05-26 DIAGNOSIS — M62830 Muscle spasm of back: Secondary | ICD-10-CM | POA: Diagnosis not present

## 2019-05-26 DIAGNOSIS — M9902 Segmental and somatic dysfunction of thoracic region: Secondary | ICD-10-CM | POA: Diagnosis not present

## 2019-05-26 DIAGNOSIS — M9901 Segmental and somatic dysfunction of cervical region: Secondary | ICD-10-CM | POA: Diagnosis not present

## 2019-05-26 DIAGNOSIS — M542 Cervicalgia: Secondary | ICD-10-CM | POA: Diagnosis not present

## 2019-06-02 DIAGNOSIS — M62830 Muscle spasm of back: Secondary | ICD-10-CM | POA: Diagnosis not present

## 2019-06-02 DIAGNOSIS — M9902 Segmental and somatic dysfunction of thoracic region: Secondary | ICD-10-CM | POA: Diagnosis not present

## 2019-06-02 DIAGNOSIS — M542 Cervicalgia: Secondary | ICD-10-CM | POA: Diagnosis not present

## 2019-06-02 DIAGNOSIS — M9901 Segmental and somatic dysfunction of cervical region: Secondary | ICD-10-CM | POA: Diagnosis not present

## 2019-06-09 DIAGNOSIS — M9902 Segmental and somatic dysfunction of thoracic region: Secondary | ICD-10-CM | POA: Diagnosis not present

## 2019-06-09 DIAGNOSIS — M62830 Muscle spasm of back: Secondary | ICD-10-CM | POA: Diagnosis not present

## 2019-06-09 DIAGNOSIS — M542 Cervicalgia: Secondary | ICD-10-CM | POA: Diagnosis not present

## 2019-06-09 DIAGNOSIS — M9901 Segmental and somatic dysfunction of cervical region: Secondary | ICD-10-CM | POA: Diagnosis not present

## 2019-06-16 DIAGNOSIS — M9901 Segmental and somatic dysfunction of cervical region: Secondary | ICD-10-CM | POA: Diagnosis not present

## 2019-06-16 DIAGNOSIS — M9902 Segmental and somatic dysfunction of thoracic region: Secondary | ICD-10-CM | POA: Diagnosis not present

## 2019-06-16 DIAGNOSIS — M542 Cervicalgia: Secondary | ICD-10-CM | POA: Diagnosis not present

## 2019-06-16 DIAGNOSIS — M62830 Muscle spasm of back: Secondary | ICD-10-CM | POA: Diagnosis not present

## 2019-06-23 DIAGNOSIS — M9902 Segmental and somatic dysfunction of thoracic region: Secondary | ICD-10-CM | POA: Diagnosis not present

## 2019-06-23 DIAGNOSIS — M9901 Segmental and somatic dysfunction of cervical region: Secondary | ICD-10-CM | POA: Diagnosis not present

## 2019-06-23 DIAGNOSIS — M542 Cervicalgia: Secondary | ICD-10-CM | POA: Diagnosis not present

## 2019-06-23 DIAGNOSIS — M62830 Muscle spasm of back: Secondary | ICD-10-CM | POA: Diagnosis not present

## 2019-07-18 DIAGNOSIS — Z23 Encounter for immunization: Secondary | ICD-10-CM | POA: Diagnosis not present

## 2019-08-23 DIAGNOSIS — Z808 Family history of malignant neoplasm of other organs or systems: Secondary | ICD-10-CM | POA: Diagnosis not present

## 2019-08-23 DIAGNOSIS — Z0001 Encounter for general adult medical examination with abnormal findings: Secondary | ICD-10-CM | POA: Diagnosis not present

## 2019-08-23 DIAGNOSIS — D473 Essential (hemorrhagic) thrombocythemia: Secondary | ICD-10-CM | POA: Diagnosis not present

## 2019-08-23 DIAGNOSIS — E049 Nontoxic goiter, unspecified: Secondary | ICD-10-CM | POA: Diagnosis not present
